# Patient Record
Sex: Female | Born: 1975 | Race: White | Hispanic: Yes | Smoking: Never smoker
Health system: Southern US, Community
[De-identification: ages and names within clinical notes are randomized; demographics above are authoritative.]

## PROBLEM LIST (undated history)

## (undated) DIAGNOSIS — Z789 Other specified health status: Secondary | ICD-10-CM

## (undated) HISTORY — PX: NO PAST SURGERIES: SHX2092

---

## 1999-09-05 ENCOUNTER — Inpatient Hospital Stay (HOSPITAL_COMMUNITY): Admission: AD | Admit: 1999-09-05 | Discharge: 1999-09-05 | Payer: Self-pay | Admitting: *Deleted

## 1999-09-23 ENCOUNTER — Other Ambulatory Visit: Admission: RE | Admit: 1999-09-23 | Discharge: 1999-09-23 | Payer: Self-pay | Admitting: Obstetrics

## 1999-09-23 ENCOUNTER — Encounter: Admission: RE | Admit: 1999-09-23 | Discharge: 1999-09-23 | Payer: Self-pay | Admitting: Obstetrics

## 1999-10-15 ENCOUNTER — Encounter: Payer: Self-pay | Admitting: Obstetrics

## 1999-10-15 ENCOUNTER — Ambulatory Visit (HOSPITAL_COMMUNITY): Admission: RE | Admit: 1999-10-15 | Discharge: 1999-10-15 | Payer: Self-pay | Admitting: Obstetrics

## 1999-11-02 ENCOUNTER — Encounter: Admission: RE | Admit: 1999-11-02 | Discharge: 1999-11-02 | Payer: Self-pay | Admitting: Obstetrics & Gynecology

## 2000-02-02 ENCOUNTER — Emergency Department (HOSPITAL_COMMUNITY): Admission: EM | Admit: 2000-02-02 | Discharge: 2000-02-02 | Payer: Self-pay | Admitting: Emergency Medicine

## 2000-02-02 ENCOUNTER — Encounter: Payer: Self-pay | Admitting: Emergency Medicine

## 2000-02-11 ENCOUNTER — Encounter: Admission: RE | Admit: 2000-02-11 | Discharge: 2000-02-11 | Payer: Self-pay | Admitting: Obstetrics & Gynecology

## 2001-03-23 ENCOUNTER — Emergency Department (HOSPITAL_COMMUNITY): Admission: EM | Admit: 2001-03-23 | Discharge: 2001-03-23 | Payer: Self-pay | Admitting: Emergency Medicine

## 2001-03-28 ENCOUNTER — Emergency Department (HOSPITAL_COMMUNITY): Admission: EM | Admit: 2001-03-28 | Discharge: 2001-03-28 | Payer: Self-pay | Admitting: Emergency Medicine

## 2001-04-02 ENCOUNTER — Encounter: Admission: RE | Admit: 2001-04-02 | Discharge: 2001-04-02 | Payer: Self-pay | Admitting: Family Medicine

## 2001-04-04 ENCOUNTER — Encounter: Payer: Self-pay | Admitting: Family Medicine

## 2001-04-04 ENCOUNTER — Encounter: Admission: RE | Admit: 2001-04-04 | Discharge: 2001-04-04 | Payer: Self-pay | Admitting: Family Medicine

## 2001-10-23 ENCOUNTER — Emergency Department (HOSPITAL_COMMUNITY): Admission: EM | Admit: 2001-10-23 | Discharge: 2001-10-23 | Payer: Self-pay | Admitting: Emergency Medicine

## 2003-02-17 ENCOUNTER — Encounter: Admission: RE | Admit: 2003-02-17 | Discharge: 2003-02-17 | Payer: Self-pay | Admitting: Family Medicine

## 2003-02-19 ENCOUNTER — Encounter: Payer: Self-pay | Admitting: Sports Medicine

## 2003-02-19 ENCOUNTER — Encounter: Admission: RE | Admit: 2003-02-19 | Discharge: 2003-02-19 | Payer: Self-pay | Admitting: Sports Medicine

## 2003-02-21 ENCOUNTER — Encounter: Admission: RE | Admit: 2003-02-21 | Discharge: 2003-02-21 | Payer: Self-pay | Admitting: Family Medicine

## 2003-10-11 ENCOUNTER — Inpatient Hospital Stay (HOSPITAL_COMMUNITY): Admission: AD | Admit: 2003-10-11 | Discharge: 2003-10-11 | Payer: Self-pay | Admitting: *Deleted

## 2003-10-13 ENCOUNTER — Inpatient Hospital Stay (HOSPITAL_COMMUNITY): Admission: AD | Admit: 2003-10-13 | Discharge: 2003-10-13 | Payer: Self-pay | Admitting: Obstetrics & Gynecology

## 2003-10-15 ENCOUNTER — Encounter (INDEPENDENT_AMBULATORY_CARE_PROVIDER_SITE_OTHER): Payer: Self-pay | Admitting: Specialist

## 2003-10-15 ENCOUNTER — Ambulatory Visit (HOSPITAL_COMMUNITY): Admission: RE | Admit: 2003-10-15 | Discharge: 2003-10-15 | Payer: Self-pay | Admitting: Obstetrics and Gynecology

## 2006-12-07 DIAGNOSIS — N83209 Unspecified ovarian cyst, unspecified side: Secondary | ICD-10-CM

## 2007-12-11 ENCOUNTER — Ambulatory Visit (HOSPITAL_COMMUNITY): Admission: RE | Admit: 2007-12-11 | Discharge: 2007-12-11 | Payer: Self-pay | Admitting: Family Medicine

## 2008-04-14 ENCOUNTER — Ambulatory Visit (HOSPITAL_COMMUNITY): Admission: RE | Admit: 2008-04-14 | Discharge: 2008-04-14 | Payer: Self-pay | Admitting: Family Medicine

## 2008-04-18 ENCOUNTER — Ambulatory Visit: Payer: Self-pay | Admitting: Physician Assistant

## 2008-04-18 ENCOUNTER — Inpatient Hospital Stay (HOSPITAL_COMMUNITY): Admission: AD | Admit: 2008-04-18 | Discharge: 2008-04-20 | Payer: Self-pay | Admitting: Obstetrics and Gynecology

## 2008-10-14 ENCOUNTER — Emergency Department (HOSPITAL_COMMUNITY): Admission: EM | Admit: 2008-10-14 | Discharge: 2008-10-14 | Payer: Self-pay | Admitting: Emergency Medicine

## 2010-01-01 ENCOUNTER — Emergency Department (HOSPITAL_COMMUNITY): Admission: EM | Admit: 2010-01-01 | Discharge: 2010-01-01 | Payer: Self-pay | Admitting: Emergency Medicine

## 2011-01-03 LAB — URINALYSIS, ROUTINE W REFLEX MICROSCOPIC
Glucose, UA: NEGATIVE mg/dL
Hgb urine dipstick: NEGATIVE
pH: 8 (ref 5.0–8.0)

## 2011-01-03 LAB — DIFFERENTIAL
Eosinophils Absolute: 0.1 10*3/uL (ref 0.0–0.7)
Eosinophils Relative: 1 % (ref 0–5)
Lymphocytes Relative: 37 % (ref 12–46)
Lymphs Abs: 2.7 10*3/uL (ref 0.7–4.0)
Monocytes Absolute: 0.5 10*3/uL (ref 0.1–1.0)

## 2011-01-03 LAB — CBC
MCHC: 34.3 g/dL (ref 30.0–36.0)
MCV: 89.2 fL (ref 78.0–100.0)
Platelets: 246 10*3/uL (ref 150–400)
RBC: 4.4 MIL/uL (ref 3.87–5.11)
WBC: 7.2 10*3/uL (ref 4.0–10.5)

## 2011-01-03 LAB — BASIC METABOLIC PANEL
BUN: 8 mg/dL (ref 6–23)
Calcium: 9.1 mg/dL (ref 8.4–10.5)
Creatinine, Ser: 0.71 mg/dL (ref 0.4–1.2)
GFR calc Af Amer: >60 mL/min (ref >60)

## 2011-01-03 LAB — WET PREP, GENITAL
Clue Cells Wet Prep HPF POC: NONE SEEN
Trich, Wet Prep: NONE SEEN
WBC, Wet Prep HPF POC: NONE SEEN
Yeast Wet Prep HPF POC: NONE SEEN

## 2011-02-25 NOTE — Op Note (Signed)
[name], [name]                   ACCOUNT NO.:  [id number]   MEDICAL RECORD NO.:  [id number]                   PATIENT TYPE:  AMB   LOCATION:  SDC                                  FACILITY:  [facility]   PHYSICIAN:  Phil D. Okey Dupre, M.D.                  DATE OF BIRTH:  1975-11-23   DATE OF PROCEDURE:  10/15/2003  DATE OF DISCHARGE:                                 OPERATIVE REPORT   PREOPERATIVE DIAGNOSIS:  Missed abortion.   POSTOPERATIVE DIAGNOSIS:  Incomplete abortion.   PROCEDURE:  Dilatation and evacuation.   SURGEON:  Javier Glazier. Okey Dupre, M.D.   ANESTHESIA:  MAC plus local.   ESTIMATED BLOOD LOSS:  5 mL.   The procedure went as follows:  Under satisfactory MAC analgesia with the  patient in the dorsal lithotomy position, the perineum and vagina were  prepped and draped in the usual sterile manner.  Bimanual pelvic examination  revealed the uterus about six weeks' gestational size with a cervix that  easily admitted a fingertip.  A weighted speculum was placed in the  posterior fourchette of the vagina.  The uterine cavity was sounded to a  depth of 10 cm.  The cervical os easily dilated to a #10 Hegar dilator and a  10 curved suction curette was used to evacuate the uterine cavity without  incident after 10 mL of 1% Xylocaine had been placed in both of the lateral  paracervical areas at 8 and 4 o'clock for local relief.  The products of  conception were sent for pathologic diagnosis.  The tenaculum and speculum  were removed from the vagina and the patient transferred to the recovery  room in satisfactory condition.                                               Phil D. Okey Dupre, M.D.    PDR/MEDQ  D:  10/15/2003  T:  10/15/2003  Job:  [id number]

## 2011-07-07 LAB — CBC
Platelets: 260
RDW: 14.1
WBC: 9

## 2011-07-07 LAB — RPR: RPR Ser Ql: NONREACTIVE

## 2014-01-27 ENCOUNTER — Emergency Department (HOSPITAL_COMMUNITY): Payer: No Typology Code available for payment source

## 2014-01-27 ENCOUNTER — Emergency Department (HOSPITAL_COMMUNITY)
Admission: EM | Admit: 2014-01-27 | Discharge: 2014-01-27 | Disposition: A | Discharge status: Home / Self Care | Payer: No Typology Code available for payment source | Attending: Emergency Medicine | Admitting: Emergency Medicine

## 2014-01-27 ENCOUNTER — Encounter (HOSPITAL_COMMUNITY): Payer: Self-pay | Admitting: Emergency Medicine

## 2014-01-27 DIAGNOSIS — IMO0002 Reserved for concepts with insufficient information to code with codable children: Secondary | ICD-10-CM | POA: Insufficient documentation

## 2014-01-27 DIAGNOSIS — A499 Bacterial infection, unspecified: Secondary | ICD-10-CM | POA: Insufficient documentation

## 2014-01-27 DIAGNOSIS — K5900 Constipation, unspecified: Secondary | ICD-10-CM | POA: Insufficient documentation

## 2014-01-27 DIAGNOSIS — Z3202 Encounter for pregnancy test, result negative: Secondary | ICD-10-CM | POA: Insufficient documentation

## 2014-01-27 DIAGNOSIS — N760 Acute vaginitis: Secondary | ICD-10-CM | POA: Insufficient documentation

## 2014-01-27 DIAGNOSIS — B9689 Other specified bacterial agents as the cause of diseases classified elsewhere: Secondary | ICD-10-CM | POA: Insufficient documentation

## 2014-01-27 DIAGNOSIS — R079 Chest pain, unspecified: Secondary | ICD-10-CM | POA: Insufficient documentation

## 2014-01-27 DIAGNOSIS — L02419 Cutaneous abscess of limb, unspecified: Secondary | ICD-10-CM

## 2014-01-27 LAB — CBC WITH DIFFERENTIAL/PLATELET
BASOS ABS: 0.1 10*3/uL (ref 0.0–0.1)
BASOS PCT: 1 % (ref 0–1)
EOS PCT: 4 % (ref 0–5)
Eosinophils Absolute: 0.4 10*3/uL (ref 0.0–0.7)
HEMATOCRIT: 38.2 % (ref 36.0–46.0)
HEMOGLOBIN: 13.1 g/dL (ref 12.0–15.0)
Lymphocytes Relative: 24 % (ref 12–46)
Lymphs Abs: 2.2 10*3/uL (ref 0.7–4.0)
MCH: 30.4 pg (ref 26.0–34.0)
MCHC: 34.3 g/dL (ref 30.0–36.0)
MCV: 88.6 fL (ref 78.0–100.0)
MONO ABS: 0.6 10*3/uL (ref 0.1–1.0)
MONOS PCT: 6 % (ref 3–12)
Neutro Abs: 6.1 10*3/uL (ref 1.7–7.7)
Neutrophils Relative %: 65 % (ref 43–77)
Platelets: 329 10*3/uL (ref 150–400)
RBC: 4.31 MIL/uL (ref 3.87–5.11)
RDW: 13.4 % (ref 11.5–15.5)
WBC: 9.3 10*3/uL (ref 4.0–10.5)

## 2014-01-27 LAB — I-STAT TROPONIN, ED: TROPONIN I, POC: 0 ng/mL (ref 0.00–0.08)

## 2014-01-27 LAB — PREGNANCY, URINE: Preg Test, Ur: NEGATIVE

## 2014-01-27 LAB — COMPREHENSIVE METABOLIC PANEL
ALBUMIN: 3.7 g/dL (ref 3.5–5.2)
ALT: 19 U/L (ref 0–35)
AST: 18 U/L (ref 0–37)
Alkaline Phosphatase: 68 U/L (ref 39–117)
BUN: 18 mg/dL (ref 6–23)
CALCIUM: 9 mg/dL (ref 8.4–10.5)
CO2: 24 meq/L (ref 19–32)
CREATININE: 0.72 mg/dL (ref 0.50–1.10)
Chloride: 100 mEq/L (ref 96–112)
GFR calc Af Amer: >90 mL/min (ref >90)
Glucose, Bld: 147 mg/dL — ABNORMAL HIGH (ref 70–99)
Potassium: 4.1 mEq/L (ref 3.7–5.3)
Sodium: 137 mEq/L (ref 137–147)
Total Protein: 7.6 g/dL (ref 6.0–8.3)

## 2014-01-27 LAB — LIPASE, BLOOD: LIPASE: 27 U/L (ref 11–59)

## 2014-01-27 LAB — WET PREP, GENITAL
TRICH WET PREP: NONE SEEN
YEAST WET PREP: NONE SEEN

## 2014-01-27 LAB — URINALYSIS, ROUTINE W REFLEX MICROSCOPIC
BILIRUBIN URINE: NEGATIVE
GLUCOSE, UA: NEGATIVE mg/dL
HGB URINE DIPSTICK: NEGATIVE
KETONES UR: NEGATIVE mg/dL
Leukocytes, UA: NEGATIVE
Nitrite: NEGATIVE
PROTEIN: NEGATIVE mg/dL
Specific Gravity, Urine: 1.016 (ref 1.005–1.030)
UROBILINOGEN UA: 0.2 mg/dL (ref 0.0–1.0)
pH: 5 (ref 5.0–8.0)

## 2014-01-27 LAB — D-DIMER, QUANTITATIVE (NOT AT ARMC)

## 2014-01-27 MED ORDER — KETOROLAC TROMETHAMINE 30 MG/ML IJ SOLN
30.0000 mg | Freq: Once | INTRAMUSCULAR | Status: AC
  Administered 2014-01-27: 30 mg via INTRAVENOUS
  Filled 2014-01-27: qty 1

## 2014-01-27 MED ORDER — CLINDAMYCIN HCL 300 MG PO CAPS: 300.0000 mg | ORAL_CAPSULE | Freq: Three times a day (TID) | ORAL | Status: DC

## 2014-01-27 MED ORDER — SODIUM CHLORIDE 0.9 % IV BOLUS (SEPSIS)
1000.0000 mL | Freq: Once | INTRAVENOUS | Status: AC
  Administered 2014-01-27: 1000 mL via INTRAVENOUS

## 2014-01-27 MED ORDER — CLINDAMYCIN HCL 300 MG PO CAPS
300.0000 mg | ORAL_CAPSULE | Freq: Once | ORAL | Status: AC
  Administered 2014-01-27: 300 mg via ORAL
  Filled 2014-01-27: qty 1

## 2014-01-27 NOTE — ED Provider Notes (Signed)
CSN: [id number]     Arrival date & time 01/27/14  1712 History   First MD Initiated Contact with Patient 01/27/14 2028     Chief Complaint  Patient presents with  . Abdominal Pain  . Chest Pain     (Consider location/radiation/quality/duration/timing/severity/associated sxs/prior Treatment) The history is provided by the patient.  Joanna Vega is a 38 y.o. female here with LLQ pain. LLQ pain for several years, worse in last 2 weeks. She had her period yesterday and had more pain today. Denies vaginal discharge. Denies urinary symptoms. Denies being pregnancy. Had some shortness of breath when she is in pain but denies shortness of breath at rest. Denies fever or chills or cough. Also noticed left axillary swelling since yesterday. Denies purulent drainage from axilla. No history of MRSA.    History reviewed. No pertinent past medical history. History reviewed. No pertinent past surgical history. No family history on file. History  Substance Use Topics  . Smoking status: Never Smoker   . Smokeless tobacco: Not on file  . Alcohol Use: No   OB History   Grav Para Term Preterm Abortions TAB SAB Ect Mult Living                 Review of Systems  Cardiovascular: Positive for chest pain.  Gastrointestinal: Positive for abdominal pain.  All other systems reviewed and are negative.     Allergies  Review of patient's allergies indicates no known allergies.  Home Medications   Prior to Admission medications   Not on File   BP 107/57  Pulse 62  Temp(Src) 98.3 F (36.8 C) (Oral)  Resp 18  Wt 118 lb (53.524 kg)  SpO2 100%  LMP 01/26/2014 Physical Exam  Nursing note and vitals reviewed. Constitutional: She is oriented to person, place, and time. She appears well-developed and well-nourished.  HENT:  Head: Normocephalic.  Mouth/Throat: Oropharynx is clear and moist.  Eyes: Conjunctivae and EOM are normal. Pupils are equal, round, and reactive to light.  Neck:  Normal range of motion. Neck supple.  Cardiovascular: Normal rate, regular rhythm and normal heart sounds.   Pulmonary/Chest: Effort normal and breath sounds normal. No respiratory distress. She has no wheezes. She has no rales.  Abdominal: Soft. Bowel sounds are normal.  Mild LLQ tenderness, no rebound. No CVAT   Genitourinary:  No bleeding or discharge. No CMT. Mild L adnexal tenderness   Musculoskeletal: Normal range of motion.  Neurological: She is alert and oriented to person, place, and time. No cranial nerve deficit. Coordination normal.  Skin: Skin is warm and dry.  L axilla with small 1 cm fluctuance   Psychiatric: She has a normal mood and affect. Her behavior is normal. Judgment and thought content normal.    ED Course  Procedures (including critical care time) Labs Review Labs Reviewed  WET PREP, GENITAL - Abnormal; Notable for the following:    Clue Cells Wet Prep HPF POC FEW (*)    WBC, Wet Prep HPF POC FEW (*)    All other components within normal limits  COMPREHENSIVE METABOLIC PANEL - Abnormal; Notable for the following:    Glucose, Bld 147 (*)    Total Bilirubin <0.2 (*)    All other components within normal limits  GC/CHLAMYDIA PROBE AMP  CBC WITH DIFFERENTIAL  LIPASE, BLOOD  PREGNANCY, URINE  URINALYSIS, ROUTINE W REFLEX MICROSCOPIC  D-DIMER, QUANTITATIVE  Rosezena Sensor, ED    Imaging Review Dg Chest 2 View  01/27/2014   CLINICAL  DATA:  Pain in lower chest  EXAM: CHEST  2 VIEW  COMPARISON:  None.  FINDINGS: Normal mediastinum and cardiac silhouette. Normal pulmonary vasculature. No evidence of effusion, infiltrate, or pneumothorax. No acute bony abnormality.  IMPRESSION: Normal chest radiograph.   Electronically Signed   By: Genevive BiStewart  Edmunds M.D.   On: 01/27/2014 18:47   Dg Abd 1 View  01/27/2014   CLINICAL DATA:  Left lower quadrant abdominal pain.  EXAM: ABDOMEN - 1 VIEW  COMPARISON:  None.  FINDINGS: Moderate right colonic stool noted.  The bowel gas  pattern is unremarkable otherwise.  No suspicious calcifications are identified.  The bony structures are unremarkable.  IMPRESSION: Moderate right colonic stool without other significant abnormality.   Electronically Signed   By: Laveda AbbeJeff  Hu M.D.   On: 01/27/2014 22:32   [place] Transvaginal Non-ob  01/27/2014   CLINICAL DATA:  Left adnexal pain.  To rule out torsion.  EXAM: TRANSABDOMINAL AND TRANSVAGINAL ULTRASOUND OF PELVIS  DOPPLER ULTRASOUND OF OVARIES  TECHNIQUE: Both transabdominal and transvaginal ultrasound examinations of the pelvis were performed. Transabdominal technique was performed for global imaging of the pelvis including uterus, ovaries, adnexal regions, and pelvic cul-de-sac.  It was necessary to proceed with endovaginal exam following the transabdominal exam to visualize the Ovaries. Color and duplex Doppler ultrasound was utilized to evaluate blood flow to the ovaries.  COMPARISON:  None.  FINDINGS: Uterus  Measurements: 7 x 3.4 x 4.3 cm. The uterus is retroflexed. No fibroids or other mass visualized.  Endometrium  Thickness: 2.3 mm, normal.  No focal abnormality visualized.  Right ovary  Measurements: 2.8 x 1.6 x 1.8 cm. Normal follicular changes. Flow is demonstrated in the right ovary on color flow Doppler imaging.  Left ovary  Measurements: 2.2 x 1.1 x 1.1 cm. Normal follicular changes. Flow is demonstrated in the left ovary on color flow Doppler imaging.  Pulsed Doppler evaluation of both ovaries demonstrates normal low-resistance arterial and venous waveforms.  Other findings  No free fluid.  Prominent gonadal veins.  Varices not excluded.  IMPRESSION: Normal sound appearance of the uterus and ovaries. No evidence of torsion.   Electronically Signed   By: Burman NievesWilliam  Stevens M.D.   On: 01/27/2014 22:15   [place] Pelvis Complete  01/27/2014   CLINICAL DATA:  Left adnexal pain.  To rule out torsion.  EXAM: TRANSABDOMINAL AND TRANSVAGINAL ULTRASOUND OF PELVIS  DOPPLER ULTRASOUND OF OVARIES   TECHNIQUE: Both transabdominal and transvaginal ultrasound examinations of the pelvis were performed. Transabdominal technique was performed for global imaging of the pelvis including uterus, ovaries, adnexal regions, and pelvic cul-de-sac.  It was necessary to proceed with endovaginal exam following the transabdominal exam to visualize the Ovaries. Color and duplex Doppler ultrasound was utilized to evaluate blood flow to the ovaries.  COMPARISON:  None.  FINDINGS: Uterus  Measurements: 7 x 3.4 x 4.3 cm. The uterus is retroflexed. No fibroids or other mass visualized.  Endometrium  Thickness: 2.3 mm, normal.  No focal abnormality visualized.  Right ovary  Measurements: 2.8 x 1.6 x 1.8 cm. Normal follicular changes. Flow is demonstrated in the right ovary on color flow Doppler imaging.  Left ovary  Measurements: 2.2 x 1.1 x 1.1 cm. Normal follicular changes. Flow is demonstrated in the left ovary on color flow Doppler imaging.  Pulsed Doppler evaluation of both ovaries demonstrates normal low-resistance arterial and venous waveforms.  Other findings  No free fluid.  Prominent gonadal veins.  Varices not excluded.  IMPRESSION: Normal  sound appearance of the uterus and ovaries. No evidence of torsion.   Electronically Signed   By: Burman NievesWilliam  Stevens M.D.   On: 01/27/2014 22:15   [place] Art/ven Flow Abd Pelv Doppler  01/27/2014   CLINICAL DATA:  Left adnexal pain.  To rule out torsion.  EXAM: TRANSABDOMINAL AND TRANSVAGINAL ULTRASOUND OF PELVIS  DOPPLER ULTRASOUND OF OVARIES  TECHNIQUE: Both transabdominal and transvaginal ultrasound examinations of the pelvis were performed. Transabdominal technique was performed for global imaging of the pelvis including uterus, ovaries, adnexal regions, and pelvic cul-de-sac.  It was necessary to proceed with endovaginal exam following the transabdominal exam to visualize the Ovaries. Color and duplex Doppler ultrasound was utilized to evaluate blood flow to the ovaries.  COMPARISON:   None.  FINDINGS: Uterus  Measurements: 7 x 3.4 x 4.3 cm. The uterus is retroflexed. No fibroids or other mass visualized.  Endometrium  Thickness: 2.3 mm, normal.  No focal abnormality visualized.  Right ovary  Measurements: 2.8 x 1.6 x 1.8 cm. Normal follicular changes. Flow is demonstrated in the right ovary on color flow Doppler imaging.  Left ovary  Measurements: 2.2 x 1.1 x 1.1 cm. Normal follicular changes. Flow is demonstrated in the left ovary on color flow Doppler imaging.  Pulsed Doppler evaluation of both ovaries demonstrates normal low-resistance arterial and venous waveforms.  Other findings  No free fluid.  Prominent gonadal veins.  Varices not excluded.  IMPRESSION: Normal sound appearance of the uterus and ovaries. No evidence of torsion.   Electronically Signed   By: Burman NievesWilliam  Stevens M.D.   On: 01/27/2014 22:15     EKG Interpretation None      MDM   Final diagnoses:  None   Joanna Vega is a 38 y.o. female here with L axilla early abscess and LLQ pain. LLQ pain likely constipation vs ruptured cyst vs torsion. SOB with pain, low risk for PE so will get d-dimer. I discussed with her regarding treatment options for the early abscess. I counseled her that these likely will return after I and D. Will give course of abx and will have her f/u with surgery for excision.   10:51 PM US showed no torsion or cysts. Xray showed constipation. D-dimer neg. Wet prep ? BV but has no symptoms. Given clinda which can help with cellulitis and possible BV. Abdominal pain likely from constipation vs BV. WBC nl. I doubt appendicitis. Stable for d/c.    Richardean Canalavid H Saretta Dahlem, MD 01/27/14 2252

## 2014-01-27 NOTE — ED Notes (Signed)
Pt reports LLQ abdominal pain, chest pain when she takes a deep breath for 2 weeks. Also abscess under left axilla. Denies vaginal bleeding or discharge. Pt is a x4. No n/v/d

## 2014-01-27 NOTE — Discharge Instructions (Signed)
Take clindamycin three times a day for a week.   Stay hydrated. Eat high fiber diet.   Apply warm soaks to the abscess.   Follow up with your regular doctor and a Careers advisersurgeon.   Try not to shave your armpits.   Return to ER if you have severe pain, vomiting, fever.

## 2014-01-28 LAB — GC/CHLAMYDIA PROBE AMP
CT Probe RNA: NEGATIVE
GC Probe RNA: NEGATIVE

## 2014-07-21 ENCOUNTER — Other Ambulatory Visit (HOSPITAL_COMMUNITY): Payer: Self-pay | Admitting: Nurse Practitioner

## 2014-07-21 DIAGNOSIS — Z3689 Encounter for other specified antenatal screening: Secondary | ICD-10-CM

## 2014-07-21 LAB — OB RESULTS CONSOLE ABO/RH: RH Type: POSITIVE

## 2014-07-21 LAB — OB RESULTS CONSOLE GC/CHLAMYDIA
CHLAMYDIA, DNA PROBE: NEGATIVE
GC PROBE AMP, GENITAL: NEGATIVE

## 2014-07-21 LAB — OB RESULTS CONSOLE HIV ANTIBODY (ROUTINE TESTING): HIV: NONREACTIVE

## 2014-07-21 LAB — OB RESULTS CONSOLE RPR: RPR: NONREACTIVE

## 2014-07-21 LAB — OB RESULTS CONSOLE RUBELLA ANTIBODY, IGM: Rubella: IMMUNE

## 2014-07-21 LAB — OB RESULTS CONSOLE ANTIBODY SCREEN: Antibody Screen: NEGATIVE

## 2014-07-21 LAB — OB RESULTS CONSOLE HEPATITIS B SURFACE ANTIGEN: HEP B S AG: NEGATIVE

## 2014-08-18 ENCOUNTER — Other Ambulatory Visit (HOSPITAL_COMMUNITY): Payer: Self-pay | Admitting: Nurse Practitioner

## 2014-08-18 DIAGNOSIS — O09522 Supervision of elderly multigravida, second trimester: Secondary | ICD-10-CM

## 2014-08-18 DIAGNOSIS — Z3689 Encounter for other specified antenatal screening: Secondary | ICD-10-CM

## 2014-08-19 ENCOUNTER — Ambulatory Visit (HOSPITAL_COMMUNITY)
Admission: RE | Admit: 2014-08-19 | Discharge: 2014-08-19 | Disposition: A | Discharge status: Home / Self Care | Payer: Medicaid Other | Source: Ambulatory Visit | Attending: Nurse Practitioner | Admitting: Nurse Practitioner

## 2014-08-19 ENCOUNTER — Other Ambulatory Visit (HOSPITAL_COMMUNITY): Payer: Self-pay | Admitting: Nurse Practitioner

## 2014-08-19 DIAGNOSIS — O4402 Placenta previa specified as without hemorrhage, second trimester: Secondary | ICD-10-CM | POA: Insufficient documentation

## 2014-08-19 DIAGNOSIS — O09529 Supervision of elderly multigravida, unspecified trimester: Secondary | ICD-10-CM | POA: Insufficient documentation

## 2014-08-19 DIAGNOSIS — O09522 Supervision of elderly multigravida, second trimester: Secondary | ICD-10-CM | POA: Diagnosis present

## 2014-08-19 DIAGNOSIS — Z36 Encounter for antenatal screening of mother: Secondary | ICD-10-CM | POA: Diagnosis not present

## 2014-08-19 DIAGNOSIS — Z3A19 19 weeks gestation of pregnancy: Secondary | ICD-10-CM | POA: Insufficient documentation

## 2014-08-19 DIAGNOSIS — Z3689 Encounter for other specified antenatal screening: Secondary | ICD-10-CM

## 2014-08-26 ENCOUNTER — Other Ambulatory Visit (HOSPITAL_COMMUNITY): Payer: Self-pay | Admitting: Physician Assistant

## 2014-08-26 DIAGNOSIS — Z0372 Encounter for suspected placental problem ruled out: Secondary | ICD-10-CM

## 2014-09-23 ENCOUNTER — Ambulatory Visit (HOSPITAL_COMMUNITY)
Admission: RE | Admit: 2014-09-23 | Discharge: 2014-09-23 | Disposition: A | Discharge status: Home / Self Care | Payer: Medicaid Other | Source: Ambulatory Visit | Attending: Physician Assistant | Admitting: Physician Assistant

## 2014-09-23 ENCOUNTER — Other Ambulatory Visit (HOSPITAL_COMMUNITY): Payer: Self-pay | Admitting: Physician Assistant

## 2014-09-23 ENCOUNTER — Encounter (HOSPITAL_COMMUNITY): Payer: Self-pay

## 2014-09-23 DIAGNOSIS — O4440 Low lying placenta NOS or without hemorrhage, unspecified trimester: Secondary | ICD-10-CM | POA: Insufficient documentation

## 2014-09-23 DIAGNOSIS — O4402 Placenta previa specified as without hemorrhage, second trimester: Secondary | ICD-10-CM | POA: Insufficient documentation

## 2014-09-23 DIAGNOSIS — Z3A24 24 weeks gestation of pregnancy: Secondary | ICD-10-CM | POA: Insufficient documentation

## 2014-09-23 DIAGNOSIS — O09529 Supervision of elderly multigravida, unspecified trimester: Secondary | ICD-10-CM | POA: Insufficient documentation

## 2014-09-23 DIAGNOSIS — Z0372 Encounter for suspected placental problem ruled out: Secondary | ICD-10-CM

## 2014-09-23 DIAGNOSIS — Z36 Encounter for antenatal screening of mother: Secondary | ICD-10-CM | POA: Insufficient documentation

## 2014-09-23 DIAGNOSIS — O09522 Supervision of elderly multigravida, second trimester: Secondary | ICD-10-CM | POA: Insufficient documentation

## 2014-09-23 HISTORY — DX: Other specified health status: Z78.9

## 2014-09-23 NOTE — ED Notes (Signed)
Eda Royal Spanish Interpreter at the bedside.

## 2014-10-10 NOTE — L&D Delivery Note (Addendum)
Patient is 39 y.o. [id number] 8625w2d admitted in active labor, hx of AMA, polyhydramnios   Delivery Note At 11:45 AM a viable female was delivered via Vaginal, Spontaneous Delivery (Presentation: Right Occiput Anterior).  APGAR: 8, 9; weight  pending Placenta status: Intact, Spontaneous.  Cord: 3 vessels with the following complications: none  Anesthesia: Epidural  Episiotomy: None Lacerations: None Suture Repair: n/a Est. Blood Loss (mL):  400mL  cytotec 1000mcg PR given 2/2 slow trickle   Mom to postpartum.  Baby to Couplet care / Skin to Skin.  Erina Hamme ROCIO 01/06/2015, 12:20 PM   Reevaluated 2/2 increased bleeding, 50cc clot evaluated from uterus, fundus still firm.    Total EBL 600mL, no methergine needed at this time.  Perry MountACOSTA,Tomma Ehinger ROCIO, MD 1:47 PM

## 2014-11-04 ENCOUNTER — Other Ambulatory Visit (HOSPITAL_COMMUNITY): Payer: Self-pay | Admitting: Physician Assistant

## 2014-11-04 ENCOUNTER — Encounter (HOSPITAL_COMMUNITY): Payer: Self-pay

## 2014-11-04 ENCOUNTER — Ambulatory Visit (HOSPITAL_COMMUNITY)
Admission: RE | Admit: 2014-11-04 | Discharge: 2014-11-04 | Disposition: A | Discharge status: Home / Self Care | Payer: Medicaid Other | Source: Ambulatory Visit | Attending: Physician Assistant | Admitting: Physician Assistant

## 2014-11-04 DIAGNOSIS — Z0372 Encounter for suspected placental problem ruled out: Secondary | ICD-10-CM

## 2014-11-04 DIAGNOSIS — O4403 Placenta previa specified as without hemorrhage, third trimester: Secondary | ICD-10-CM | POA: Insufficient documentation

## 2014-11-04 DIAGNOSIS — Z3A30 30 weeks gestation of pregnancy: Secondary | ICD-10-CM | POA: Insufficient documentation

## 2014-11-04 DIAGNOSIS — O09523 Supervision of elderly multigravida, third trimester: Secondary | ICD-10-CM | POA: Insufficient documentation

## 2014-11-04 DIAGNOSIS — O409XX0 Polyhydramnios, unspecified trimester, not applicable or unspecified: Secondary | ICD-10-CM | POA: Insufficient documentation

## 2014-11-04 NOTE — ED Notes (Signed)
Mariel Gallege spanish interpreter with pt. 

## 2014-11-20 ENCOUNTER — Other Ambulatory Visit (HOSPITAL_COMMUNITY): Payer: Self-pay | Admitting: Physician Assistant

## 2014-11-20 ENCOUNTER — Ambulatory Visit (HOSPITAL_COMMUNITY)
Admission: RE | Admit: 2014-11-20 | Discharge: 2014-11-20 | Disposition: A | Discharge status: Home / Self Care | Payer: Medicaid Other | Source: Ambulatory Visit | Attending: Physician Assistant | Admitting: Physician Assistant

## 2014-11-20 ENCOUNTER — Encounter (HOSPITAL_COMMUNITY): Payer: Self-pay

## 2014-11-20 DIAGNOSIS — O09522 Supervision of elderly multigravida, second trimester: Secondary | ICD-10-CM | POA: Insufficient documentation

## 2014-11-20 DIAGNOSIS — Z0372 Encounter for suspected placental problem ruled out: Secondary | ICD-10-CM

## 2014-11-20 DIAGNOSIS — Z3A19 19 weeks gestation of pregnancy: Secondary | ICD-10-CM | POA: Insufficient documentation

## 2014-11-20 DIAGNOSIS — Z3A32 32 weeks gestation of pregnancy: Secondary | ICD-10-CM | POA: Insufficient documentation

## 2014-11-20 DIAGNOSIS — Z36 Encounter for antenatal screening of mother: Secondary | ICD-10-CM | POA: Insufficient documentation

## 2014-11-20 NOTE — ED Notes (Signed)
Spanish interpreter Marlene Norway with pt. 

## 2014-12-20 LAB — OB RESULTS CONSOLE GBS: GBS: NEGATIVE

## 2015-01-06 ENCOUNTER — Inpatient Hospital Stay (HOSPITAL_COMMUNITY)
Admission: AD | Admit: 2015-01-06 | Discharge: 2015-01-07 | DRG: 775 | Disposition: A | Discharge status: Home / Self Care | Payer: Medicaid Other | Source: Ambulatory Visit | Attending: Family Medicine | Admitting: Family Medicine

## 2015-01-06 ENCOUNTER — Inpatient Hospital Stay (HOSPITAL_COMMUNITY): Payer: Medicaid Other | Admitting: Anesthesiology

## 2015-01-06 ENCOUNTER — Encounter (HOSPITAL_COMMUNITY): Payer: Self-pay | Admitting: *Deleted

## 2015-01-06 DIAGNOSIS — Z3A39 39 weeks gestation of pregnancy: Secondary | ICD-10-CM | POA: Diagnosis not present

## 2015-01-06 DIAGNOSIS — IMO0001 Reserved for inherently not codable concepts without codable children: Secondary | ICD-10-CM

## 2015-01-06 LAB — CBC
HCT: 36.7 % (ref 36.0–46.0)
HEMATOCRIT: 30.3 % — AB (ref 36.0–46.0)
HEMOGLOBIN: 12.5 g/dL (ref 12.0–15.0)
Hemoglobin: 10.3 g/dL — ABNORMAL LOW (ref 12.0–15.0)
MCH: 30.5 pg (ref 26.0–34.0)
MCH: 30.7 pg (ref 26.0–34.0)
MCHC: 34.1 g/dL (ref 30.0–36.0)
MCHC: 34 g/dL (ref 30.0–36.0)
MCV: 89.5 fL (ref 78.0–100.0)
MCV: 90.2 fL (ref 78.0–100.0)
Platelets: 207 10*3/uL (ref 150–400)
Platelets: 178 10*3/uL (ref 150–400)
RBC: 4.1 MIL/uL (ref 3.87–5.11)
RBC: 3.36 MIL/uL — ABNORMAL LOW (ref 3.87–5.11)
RDW: 14.6 % (ref 11.5–15.5)
RDW: 14.8 % (ref 11.5–15.5)
WBC: 10.4 10*3/uL (ref 4.0–10.5)
WBC: 16.6 10*3/uL — ABNORMAL HIGH (ref 4.0–10.5)

## 2015-01-06 LAB — TYPE AND SCREEN
ABO/RH(D): O POS
Antibody Screen: NEGATIVE

## 2015-01-06 LAB — ABO/RH: ABO/RH(D): O POS

## 2015-01-06 LAB — RPR: RPR Ser Ql: NONREACTIVE

## 2015-01-06 MED ORDER — SENNOSIDES-DOCUSATE SODIUM 8.6-50 MG PO TABS
2.0000 | ORAL_TABLET | ORAL | Status: DC
  Administered 2015-01-06: 2 via ORAL
  Filled 2015-01-06: qty 2

## 2015-01-06 MED ORDER — FENTANYL 2.5 MCG/ML BUPIVACAINE 1/10 % EPIDURAL INFUSION (WH - ANES)
14.0000 mL/h | INTRAMUSCULAR | Status: DC | PRN
  Administered 2015-01-06: 12 mL/h via EPIDURAL
  Filled 2015-01-06: qty 125

## 2015-01-06 MED ORDER — LIDOCAINE HCL (PF) 1 % IJ SOLN
INTRAMUSCULAR | Status: DC | PRN
  Administered 2015-01-06 (×2): 4 mL

## 2015-01-06 MED ORDER — FENTANYL 2.5 MCG/ML BUPIVACAINE 1/10 % EPIDURAL INFUSION (WH - ANES): 14.0000 mL/h | INTRAMUSCULAR | Status: DC | PRN

## 2015-01-06 MED ORDER — PRENATAL MULTIVITAMIN CH
1.0000 | ORAL_TABLET | Freq: Every day | ORAL | Status: DC
  Administered 2015-01-06 – 2015-01-07 (×2): 1 via ORAL
  Filled 2015-01-06 (×2): qty 1

## 2015-01-06 MED ORDER — MISOPROSTOL 200 MCG PO TABS
1000.0000 ug | ORAL_TABLET | Freq: Once | ORAL | Status: AC
  Administered 2015-01-06: 1000 ug via RECTAL

## 2015-01-06 MED ORDER — MISOPROSTOL 200 MCG PO TABS
ORAL_TABLET | ORAL | Status: AC
  Filled 2015-01-06: qty 5

## 2015-01-06 MED ORDER — OXYCODONE-ACETAMINOPHEN 5-325 MG PO TABS: 1.0000 | ORAL_TABLET | ORAL | Status: DC | PRN

## 2015-01-06 MED ORDER — ONDANSETRON HCL 4 MG PO TABS: 4.0000 mg | ORAL_TABLET | ORAL | Status: DC | PRN

## 2015-01-06 MED ORDER — SODIUM CHLORIDE 0.9 % IV SOLN: 250.0000 mL | INTRAVENOUS | Status: DC | PRN

## 2015-01-06 MED ORDER — ZOLPIDEM TARTRATE 5 MG PO TABS: 5.0000 mg | ORAL_TABLET | Freq: Every evening | ORAL | Status: DC | PRN

## 2015-01-06 MED ORDER — OXYTOCIN BOLUS FROM INFUSION
500.0000 mL | INTRAVENOUS | Status: DC
  Administered 2015-01-06: 500 mL via INTRAVENOUS

## 2015-01-06 MED ORDER — DIPHENHYDRAMINE HCL 50 MG/ML IJ SOLN: 12.5000 mg | INTRAMUSCULAR | Status: DC | PRN

## 2015-01-06 MED ORDER — LACTATED RINGERS IV SOLN: 500.0000 mL | INTRAVENOUS | Status: DC | PRN

## 2015-01-06 MED ORDER — LACTATED RINGERS IV SOLN
INTRAVENOUS | Status: DC
Start: 2015-01-06 — End: 2015-01-06
  Administered 2015-01-06: 09:00:00 via INTRAVENOUS

## 2015-01-06 MED ORDER — EPHEDRINE 5 MG/ML INJ
10.0000 mg | INTRAVENOUS | Status: DC | PRN
  Filled 2015-01-06: qty 2

## 2015-01-06 MED ORDER — BENZOCAINE-MENTHOL 20-0.5 % EX AERO: 1.0000 "application " | INHALATION_SPRAY | CUTANEOUS | Status: DC | PRN

## 2015-01-06 MED ORDER — DIPHENHYDRAMINE HCL 25 MG PO CAPS: 25.0000 mg | ORAL_CAPSULE | Freq: Four times a day (QID) | ORAL | Status: DC | PRN

## 2015-01-06 MED ORDER — PHENYLEPHRINE 40 MCG/ML (10ML) SYRINGE FOR IV PUSH (FOR BLOOD PRESSURE SUPPORT)
80.0000 ug | PREFILLED_SYRINGE | INTRAVENOUS | Status: DC | PRN
  Filled 2015-01-06: qty 2

## 2015-01-06 MED ORDER — IBUPROFEN 600 MG PO TABS
600.0000 mg | ORAL_TABLET | Freq: Four times a day (QID) | ORAL | Status: DC
  Administered 2015-01-06 – 2015-01-07 (×4): 600 mg via ORAL
  Filled 2015-01-06 (×4): qty 1

## 2015-01-06 MED ORDER — LIDOCAINE HCL (PF) 1 % IJ SOLN
30.0000 mL | INTRAMUSCULAR | Status: DC | PRN
  Filled 2015-01-06: qty 30

## 2015-01-06 MED ORDER — CITRIC ACID-SODIUM CITRATE 334-500 MG/5ML PO SOLN: 30.0000 mL | ORAL | Status: DC | PRN

## 2015-01-06 MED ORDER — OXYTOCIN 40 UNITS IN LACTATED RINGERS INFUSION - SIMPLE MED: 62.5000 mL/h | INTRAVENOUS | Status: DC | PRN

## 2015-01-06 MED ORDER — LANOLIN HYDROUS EX OINT: TOPICAL_OINTMENT | CUTANEOUS | Status: DC | PRN

## 2015-01-06 MED ORDER — ONDANSETRON HCL 4 MG/2ML IJ SOLN: 4.0000 mg | Freq: Four times a day (QID) | INTRAMUSCULAR | Status: DC | PRN

## 2015-01-06 MED ORDER — WITCH HAZEL-GLYCERIN EX PADS: 1.0000 "application " | MEDICATED_PAD | CUTANEOUS | Status: DC | PRN

## 2015-01-06 MED ORDER — OXYCODONE-ACETAMINOPHEN 5-325 MG PO TABS: 2.0000 | ORAL_TABLET | ORAL | Status: DC | PRN

## 2015-01-06 MED ORDER — SODIUM CHLORIDE 0.9 % IJ SOLN: 3.0000 mL | Freq: Two times a day (BID) | INTRAMUSCULAR | Status: DC

## 2015-01-06 MED ORDER — DIBUCAINE 1 % RE OINT: 1.0000 "application " | TOPICAL_OINTMENT | RECTAL | Status: DC | PRN

## 2015-01-06 MED ORDER — ONDANSETRON HCL 4 MG/2ML IJ SOLN: 4.0000 mg | INTRAMUSCULAR | Status: DC | PRN

## 2015-01-06 MED ORDER — PHENYLEPHRINE 40 MCG/ML (10ML) SYRINGE FOR IV PUSH (FOR BLOOD PRESSURE SUPPORT)
80.0000 ug | PREFILLED_SYRINGE | INTRAVENOUS | Status: DC | PRN
  Filled 2015-01-06: qty 2
  Filled 2015-01-06: qty 20

## 2015-01-06 MED ORDER — SIMETHICONE 80 MG PO CHEW: 80.0000 mg | CHEWABLE_TABLET | ORAL | Status: DC | PRN

## 2015-01-06 MED ORDER — OXYTOCIN 40 UNITS IN LACTATED RINGERS INFUSION - SIMPLE MED
62.5000 mL/h | INTRAVENOUS | Status: DC
  Filled 2015-01-06: qty 1000

## 2015-01-06 MED ORDER — FENTANYL CITRATE 0.05 MG/ML IJ SOLN: 100.0000 ug | INTRAMUSCULAR | Status: DC | PRN

## 2015-01-06 MED ORDER — SODIUM CHLORIDE 0.9 % IJ SOLN: 3.0000 mL | INTRAMUSCULAR | Status: DC | PRN

## 2015-01-06 MED ORDER — ACETAMINOPHEN 325 MG PO TABS: 650.0000 mg | ORAL_TABLET | ORAL | Status: DC | PRN

## 2015-01-06 MED ORDER — FLEET ENEMA 7-19 GM/118ML RE ENEM: 1.0000 | ENEMA | Freq: Every day | RECTAL | Status: DC | PRN

## 2015-01-06 MED ORDER — LACTATED RINGERS IV SOLN
500.0000 mL | Freq: Once | INTRAVENOUS | Status: AC
  Administered 2015-01-06: 500 mL via INTRAVENOUS

## 2015-01-06 MED ORDER — BISACODYL 10 MG RE SUPP: 10.0000 mg | Freq: Every day | RECTAL | Status: DC | PRN

## 2015-01-06 NOTE — Progress Notes (Signed)
I assisted Clydie BraunKaren, RN with questions. Eda H Royal  Interpreter.

## 2015-01-06 NOTE — Progress Notes (Signed)
I assisted Joanna Rilyshley Rn with some explanation of plan of care for tonight, by Orlan LeavensViria Alvarez Spanish Interpreter

## 2015-01-06 NOTE — Progress Notes (Signed)
I stopped to check on patient's need, I assisted the [facility] Surgery[facility] RN with some explanation of care, and I ordered patiet's meals, by Orlan LeavensViria Alvarez Spanish Interpreter

## 2015-01-06 NOTE — H&P (Cosign Needed)
LABOR ADMISSION HISTORY AND PHYSICAL  Joanna Vega is a 39 y.o. female [phone] with IUP at 10956w2d by 19week US presenting for active labor. She reports +FMs, No LOF, no VB, no blurry vision, headaches or peripheral edema, and RUQ pain.  She plans on breast and bottle feeding. She request Depo provera for birth control.  Spanish speaking, arriving from [place]. Most of her children were born in [place]. Has history of 5 previous pregnancies with one of these being a SAB at 12 weeks. Other pregnancies were carried to term, with one induction at 42 weeks. Baby sizes range from 7 pounds to 7 pounds 11 oz. Current baby 55th percentile in weight. US at 19 weeks initially showed placenta previa, however this has been shown to have resolved on more recent US. US in 11/04/14 showed mild polyhydramnios, however this has also been shown to have resolved in more recent US on 2/16.   Prenatal History/Complications:  Past Medical History: Past Medical History  Diagnosis Date  . Medical history non-contributory     Past Surgical History: Past Surgical History  Procedure Laterality Date  . No past surgeries      Obstetrical History: OB History    Gravida Para Term Preterm AB TAB SAB Ectopic Multiple Living   6 4 4  1  1   4       Social History: History   Social History  . Marital Status: Married    Spouse Name: N/A  . Number of Children: N/A  . Years of Education: N/A   Social History Main Topics  . Smoking status: Never Smoker   . Smokeless tobacco: Not on file  . Alcohol Use: No  . Drug Use: No  . Sexual Activity: Yes   Other Topics Concern  . None   Social History Narrative    Family History: History reviewed. No pertinent family history.  Allergies: No Known Allergies  Prescriptions prior to admission  Medication Sig Dispense Refill Last Dose  . Prenatal Vit-Fe Fumarate-FA (PRENATAL VITAMIN PO) Take by mouth.   01/05/2015 at Unknown time  . clindamycin  (CLEOCIN) 300 MG capsule Take 1 capsule (300 mg total) by mouth 3 (three) times daily. X 7 days (Patient not taking: Reported on 09/23/2014) 21 capsule 0 Unknown at Unknown time     Review of Systems   All systems reviewed and negative except as stated in HPI  Blood pressure 119/72, pulse 60, temperature 97.5 F (36.4 C), temperature source Oral, resp. rate 16, height 4\' 11"  (1.499 m), weight 61.236 kg (135 lb), last menstrual period 04/06/2014. General appearance: alert, cooperative and no distress Lungs: clear to auscultation bilaterally Heart: regular rate and rhythm Abdomen: soft, non-tender; bowel sounds normal Extremities: Homans sign is negative, no sign of DVT Presentation: cephalic Fetal monitoringBaseline: 120 bpm, Variability: Good {> 6 bpm), Accelerations: Reactive and Decelerations: Absent Uterine activity: Irregular  Dilation: 5.5 Effacement (%): 100 Station: -2 Exam by:: Elie ConferK. Weiss RN   Prenatal labs: ABO, Rh:   O Positive Antibody:  Negative Rubella:   Immune RPR:    Non-reactive HBsAg:   Negative HIV:   Non-reactive GBS:   Negative 1 hr Glucola 103, 126 Genetic screening  Negative Anatomy US Normal   No results found for this or any previous visit (from the past 24 hour(s)).  Patient Active Problem List   Diagnosis Date Noted  . [redacted] weeks gestation of pregnancy   . [redacted] weeks gestation of pregnancy   . Placental  problem suspected but not found   . Polyhydramnios   . [redacted] weeks gestation of pregnancy   . Low lying placenta without hemorrhage, antepartum   . Maternal age 73+, multigravida, antepartum   . Advanced maternal age in multigravida   . Encounter for fetal anatomic survey   . Placenta previa antepartum in second trimester   . [redacted] weeks gestation of pregnancy   . OVARIAN CYST 12/07/2006    Assessment: Joanna Vega is a 39 y.o. [id number] at [redacted]w[redacted]d here for active labor  #Labor: Active Labor. Continue to monitor. #Pain: Percocet PRN.  Fentanyl PRN. Epidural available at patient request.  #FWB: Category 1 #ID:  GBS negative #MOF: Breast/Bottle #MOC: Depo provera #Circ:  No  Araceli Bouche 01/06/2015, 3:22 AM   OB fellow attestation:  I have seen and examined this patient; I agree with above documentation in the resident's note.   Joanna Vega is a 39 y.o. [phone] here for active labor at term.   PE: BP 106/72 mmHg  Pulse 74  Temp(Src) 98.1 F (36.7 C) (Oral)  Resp 20  Ht  (1.499 m)  Wt 135 lb (61.236 kg)  BMI 27.25 kg/m2  SpO2 100%  LMP 04/06/2014 Gen: calm comfortable, NAD Resp: normal effort, no distress Abd: gravid  ROS, labs, PMH reviewed  Plan: MOF: Breast and Bottle MOC: Depo ID: GBS negative FWB: Category I Labor: Expectant management Circ: Declines Pain: Epidural  Joanna Vega 01/06/2015, 5:02 AM

## 2015-01-06 NOTE — Anesthesia Preprocedure Evaluation (Signed)

## 2015-01-06 NOTE — Plan of Care (Signed)
Problem: Consults Goal: Birthing Suites Patient Information Press F2 to bring up selections list  Outcome: Completed/Met Date Met:  01/06/15  Pt 37-[redacted] weeks EGA and Non-English Speaking

## 2015-01-06 NOTE — Anesthesia Procedure Notes (Signed)
Epidural Patient location during procedure: OB  Preanesthetic Checklist Completed: patient identified, site marked, surgical consent, pre-op evaluation, timeout performed, IV checked, risks and benefits discussed and monitors and equipment checked  Epidural Patient position: sitting Prep: site prepped and draped and DuraPrep Patient monitoring: continuous pulse ox and blood pressure Approach: midline Location: L3-L4 Injection technique: LOR air  Needle:  Needle type: Tuohy  Needle gauge: 17 G Needle length: 9 cm and 9 Needle insertion depth: 4 cm Catheter type: closed end flexible Catheter size: 19 Gauge Catheter at skin depth: 10 cm Test dose: negative  Assessment Events: blood not aspirated, injection not painful, no injection resistance, negative IV test and no paresthesia  Additional Notes Dosing of Epidural:  1st dose, through catheter ............................................. Xylocaine 30 mg  2nd dose, through catheter, after waiting 3 minutes........Xylocaine 50 mg   ( 1% Xylo charted as a single dose in Epic Meds for ease of charting; actual dosing was fractionated as above, for saftey's sake)  As each dose occurred, patient was free of IV sx; and patient exhibited no evidence of SA injection.  Patient is more comfortable after epidural dosed. Please see RN's note for documentation of vital signs,and FHR which are stable.  Patient reminded not to try to ambulate with numb legs, and that an RN must be present the 1st time she attempts to get up.    

## 2015-01-06 NOTE — Lactation Note (Signed)
This note was copied from the chart of Joanna Vega. Lactation Consultation Note  Patient Name: Joanna Vega ZOXWR'UToday's Date: 01/06/2015 Reason for consult: Initial assessment of this mom and baby at 9 hours pp. This is mom's 5th child and she states she breastfed all children for 2 years each.  Baby is latched well and mom denies any latching difficulty or nipple pain.  LC encouraged frequent STS and cue feedings.  Mom informs LC that she knows how to hand express her milk and RN had also reviewed hand expression technique.Mom encouraged to feed baby 8-12 times/24 hours and with feeding cues. LC encouraged review of Baby and Me (Spanish)  pp 9, 14 and 20-25 for STS and BF information. LC also informed parents of availability of Newborn Channel  (Spanish) during hospital stay and after discharge home, via website.LC provided [employer] Resource brochure in Spanish, and reviewed [facility] services and list of community and web site resources, especially [facility] website which has information available in [place].. This LC able to speak Spanish and mom declined need for interpreter at this time.   Maternal Data Formula Feeding for Exclusion: Yes Reason for exclusion: Mother's choice to formula and breast feed on admission Has patient been taught Hand Expression?: Yes (Mom informs LC that she knows how to hand express her milk) Does the patient have breastfeeding experience prior to this delivery?: Yes  Feeding Feeding Type: Breast Fed Length of feed: 20 min  LATCH Score/Interventions Latch: Repeated attempts needed to sustain latch, nipple held in mouth throughout feeding, stimulation needed to elicit sucking reflex. Intervention(s): Adjust position;Assist with latch;Breast compression  Audible Swallowing: A few with stimulation Intervention(s): Skin to skin;Hand expression  Type of Nipple: Everted at rest and after stimulation  Comfort (Breast/Nipple): Soft / non-tender     Hold  (Positioning): Assistance needed to correctly position infant at breast and maintain latch. Intervention(s): Breastfeeding basics reviewed;Support Pillows;Position options;Skin to skin  LATCH Score: 7  (Initial LATCH score=10, per RN assessment)  Lactation Tools Discussed/Used    STS, cue feeding, hand expression  Consult Status Consult Status: Follow-up Date: 01/07/15 Follow-up type: In-patient    Warrick ParisianBryant, Brix Brearley [facility] 01/06/2015, 9:09 PM

## 2015-01-06 NOTE — Anesthesia Postprocedure Evaluation (Signed)
  Anesthesia Post-op Note  Patient: Joanna Vega  Procedure(s) Performed: * No procedures listed *  Patient Location: Mother/Baby  Anesthesia Type:Epidural  Level of Consciousness: awake  Airway and Oxygen Therapy: Patient Spontanous Breathing  Post-op Pain: mild  Post-op Assessment: Patient's Cardiovascular Status Stable and Respiratory Function Stable  Post-op Vital Signs: stable  Last Vitals:  Filed Vitals:   01/06/15 1503  BP: 106/62  Pulse: 75  Temp: 36.9 C  Resp: 18    Complications: No apparent anesthesia complications

## 2015-01-07 ENCOUNTER — Encounter (HOSPITAL_COMMUNITY): Payer: Self-pay | Admitting: Student

## 2015-01-07 NOTE — Discharge Summary (Signed)
Obstetric Discharge Summary Reason for Admission: onset of labor Prenatal Procedures: none Intrapartum Procedures: spontaneous vaginal delivery Postpartum Procedures: none Complications-Operative and Postpartum: none  Delivery Note At 11:45 AM a viable female was delivered via Vaginal, Spontaneous Delivery (Presentation: Right Occiput Anterior).  APGAR: 8, 9; weight 8 lb 4.1 oz (3745 g).   Placenta status: Intact, Spontaneous.  Cord: 3 vessels with the following complications: .   Anesthesia: Epidural  Episiotomy: None Lacerations: None Suture Repair: N/A Est. Blood Loss (mL): 400  Mom to postpartum.  Baby to Couplet care / Skin to Skin.  Joanna Vega 01/07/2015, 7:56 AM     Hospital Course:  Active Problems:   Joanna Vega is a 39 y.o. [id number] s/p SVD PPD#1.  Patient was admitted to L&D.  She has postpartum course that was uncomplicated including no problems with ambulating, PO intake, urination, pain, or bleeding. The pt feels ready to go home and  will be discharged with outpatient follow-up. Plans for outpatient circumcision.  Today: No acute events overnight.  Pt denies problems with ambulating, voiding or po intake.  She denies nausea or vomiting.  Pain is well controlled.  She has had flatus. She has not had bowel movement.  Lochia Minimal.  Plan for birth control is  Depo-Provera.  Method of Feeding: breast   H/H: Lab Results  Component Value Date/Time   HGB 10.3* 01/06/2015 02:00 PM   HCT 30.3* 01/06/2015 02:00 PM    Discharge Diagnoses: Term Pregnancy-delivered  Discharge Information: Date: 01/07/2015 Activity: pelvic rest Diet: routine  Medications: None Breast feeding:  Yes Condition: stable Instructions: refer to handout Discharge to: home   Discharge Instructions    Call MD for:  redness, tenderness, or signs of infection (pain, swelling, redness, odor or green/yellow discharge around incision site)    Complete by:  As directed       Call MD for:  severe uncontrolled pain    Complete by:  As directed      Call MD for:  temperature >100.4    Complete by:  As directed      Diet - low sodium heart healthy    Complete by:  As directed             Medication List    TAKE these medications        PRENATAL VITAMIN PO  Take by mouth.           Follow-up Information    [facility] [place] In 6 weeks.   Contact information:   [address] [place] [id number] [phone]      Joanna Vega, Medical Student 01/07/2015,7:56 AM

## 2015-01-07 NOTE — Progress Notes (Signed)
Patient ID: Joanna Vega, female   DOB: Jul 19, 1976, 39 y.o.   MRN: [id number] Post Partum Day 1 Subjective:  Joanna Vega is a 39 y.o. [id number] 547w2d s/p SVD PPD#1.  No acute events overnight.  Pt denies problems with ambulating, voiding or po intake.  She denies nausea or vomiting.  Pain is well controlled with ibuprofen.  She has had flatus. She has not had bowel movement.  Lochia Minimal.  Plan for birth control is Depo-Provera.  Method of Feeding: breast  Objective: Blood pressure 98/59, pulse 72, temperature 97.9 F (36.6 C), temperature source Oral, resp. rate 18, height 4\' 11"  (1.499 m), weight 61.236 kg (135 lb), last menstrual period 04/06/2014, SpO2 99 %, currently breastfeeding.  Physical Exam:  General: alert, cooperative and no distress Lochia: normal flow Chest: no increased work of breathing Abdomen:  soft, nontender,  Uterine Fundus: firm, slight bilateral pelvic tenderness DVT Evaluation: No evidence of DVT seen on physical exam. Extremities: no edema   Recent Labs  01/06/15 0356 01/06/15 1400  HGB 12.5 10.3*  HCT 36.7 30.3*    Assessment/Plan:  ASSESSMENT: Joanna Vega is a [age] y.o. E3P2951G6P5015 657w2d s/p SVD PPD#1 who is doing well.    Plan for discharge tomorrow, Breastfeeding and Contraception Depo-Provera. Undecided on circumcision due to cost.    LOS: 1 day   Jeralyn RuthsNicholas A Carmyn Hamm, Medical Student 01/07/2015, 7:23 AM

## 2015-01-07 NOTE — Discharge Instructions (Signed)

## 2015-01-07 NOTE — Progress Notes (Signed)
I assisted PA with questions. Eda H Royal  Interpreter.

## 2015-01-07 NOTE — Progress Notes (Signed)
Dc teaching completed with Interpreter all questions answered.

## 2015-01-07 NOTE — Progress Notes (Signed)
UR chart review completed.  

## 2015-12-18 ENCOUNTER — Encounter: Payer: Self-pay | Admitting: Internal Medicine

## 2015-12-18 ENCOUNTER — Ambulatory Visit (INDEPENDENT_AMBULATORY_CARE_PROVIDER_SITE_OTHER): Payer: Self-pay | Admitting: Internal Medicine

## 2015-12-18 VITALS — BP 102/68 | HR 68 | Ht 59.0 in | Wt 115.0 lb

## 2015-12-18 DIAGNOSIS — F938 Other childhood emotional disorders: Secondary | ICD-10-CM

## 2015-12-18 DIAGNOSIS — M6248 Contracture of muscle, other site: Secondary | ICD-10-CM

## 2015-12-18 DIAGNOSIS — M62838 Other muscle spasm: Secondary | ICD-10-CM

## 2015-12-18 DIAGNOSIS — IMO0001 Reserved for inherently not codable concepts without codable children: Secondary | ICD-10-CM

## 2015-12-18 NOTE — Patient Instructions (Signed)
Habla clinica si tiene mas dolor de cabeza  Lehman Brothersome mucho agua todos los dias

## 2015-12-18 NOTE — Progress Notes (Signed)
   Subjective:    Patient ID: Joanna Vega, female    DOB: December 10, 1975, 40 y.o.   MRN: [id number]  HPI   New to establish  Has had pressure in her head for the past month.  Feels like someone is pulling on the left temporal area of head and then lets go.   Only happens when she is awake.  Can happen at any time--when she is busy with cooking, cleaning or when relaxing.  Pt. States the discomfort is there for a second and then gone.  States it really isn't a pain--just an odd sensation.  Discreet episodes 3 times weekly.  Not a daily occurrence. Has never had this prior to one month ago.   No history of injury to head.   Is getting Depo Provera every 3 months from PHD.  Was on DepoProvera for 5 years, became pregnant, delivered and back on it for past 10 months.  Last dose February 9th Does not take any other medication. No symptoms precede this sense of pulling of her scalp--just comes and goes. No change in frequency or severity since started. No associated photophobia, phonophobia, nausea or vomiting. May have a little stress she is dealing with as well.  Her 38 yo daughter doesn't listen to her, feels she is disrespectful, she is concerned about the young man her daughter is dating as well.       Review of Systems     Objective:   Physical Exam  Constitutional: She is oriented to person, place, and time. She appears well-developed and well-nourished.  HENT:  Head: Normocephalic and atraumatic.  Right Ear: Tympanic membrane and external ear normal.  Left Ear: Tympanic membrane and external ear normal.  Nose: Nose normal.  Mouth/Throat: Uvula is midline and oropharynx is clear and moist.  NT over left sided scalp, no bruits  Eyes: Conjunctivae and EOM are normal. Pupils are equal, round, and reactive to light.  Discs sharp bilaterally  Neck: Normal range of motion. Neck supple. No thyroid mass and no thyromegaly present.  Cardiovascular: Normal rate, regular rhythm,  normal heart sounds and intact distal pulses.   No murmur heard. Pulmonary/Chest: Effort normal and breath sounds normal.  Neurological: She is alert and oriented to person, place, and time. She has normal reflexes. She displays normal reflexes. No cranial nerve deficit. She exhibits normal muscle tone. Coordination normal.          Assessment & Plan:  1.  Left Temporal Head complaints:  Likely having intermittent muscle spasm of that area of scalp.   No concerning findings on exam. Encouraged good water intake and adequate sleep Work on stressors  2.  Difficulties with teenage daughter:  Referral to Nilda SimmerNatosha Knight, LCSW, for counseling on dealing with her daughter.  Perhaps counseling both mother and daughter.

## 2015-12-22 ENCOUNTER — Telehealth: Payer: Self-pay | Admitting: Licensed Clinical Social Worker

## 2015-12-22 NOTE — Telephone Encounter (Signed)
LCSW called pt after receiving a referral for counseling from Dr. Delrae AlfredMulberry. Pt stated that she is starting a new job this week and did not want to start counseling. She stated that she would call back in the future if she wanted to start counseling.

## 2016-01-22 IMAGING — US US OB FOLLOW-UP
1 series · 12 of 28 positions shown · non-contrast
Comparison: none

[Series 1: us ob follow-up · 0.23mm/px · 12 of 48 slices shown]
[im 2/48]
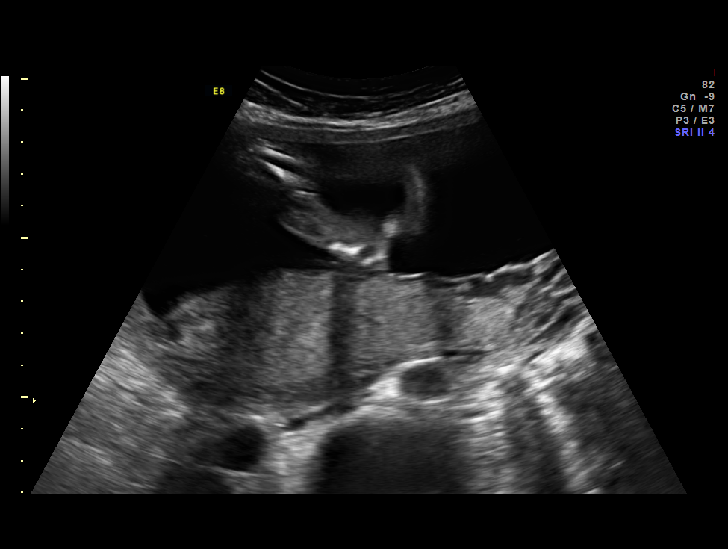
[im 6/48]
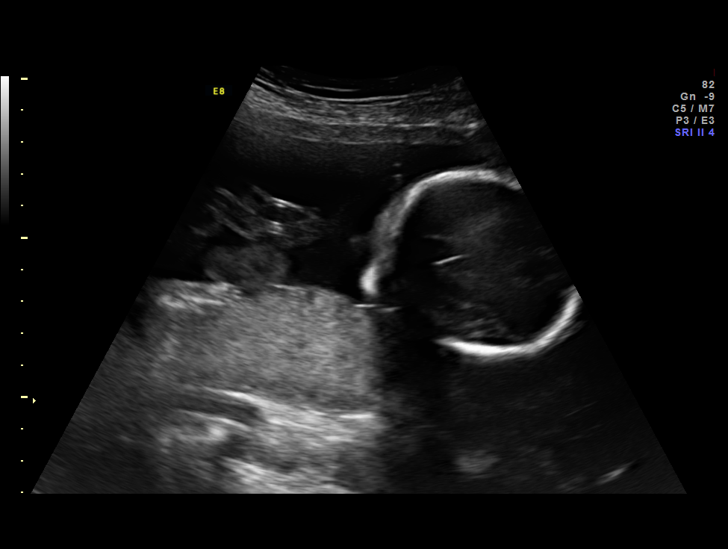
[im 9/48]
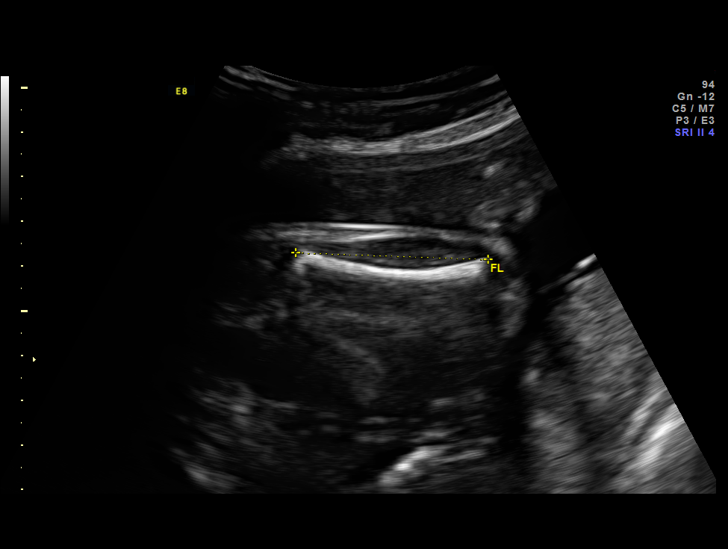
[im 14/48]
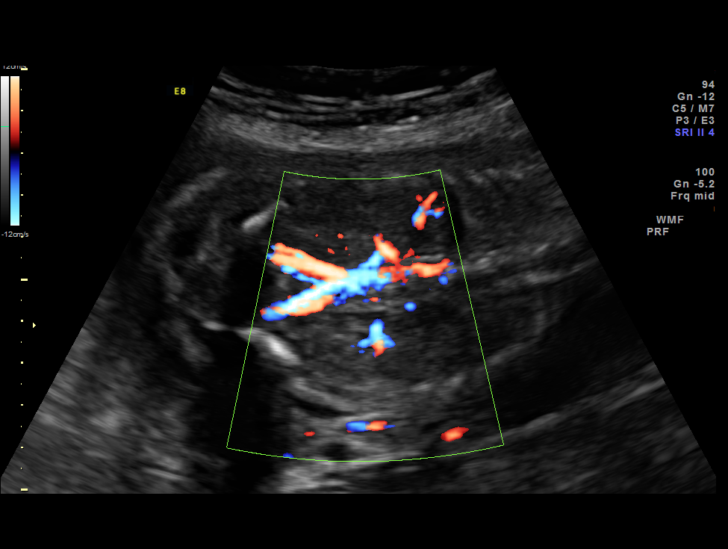
[im 18/48]
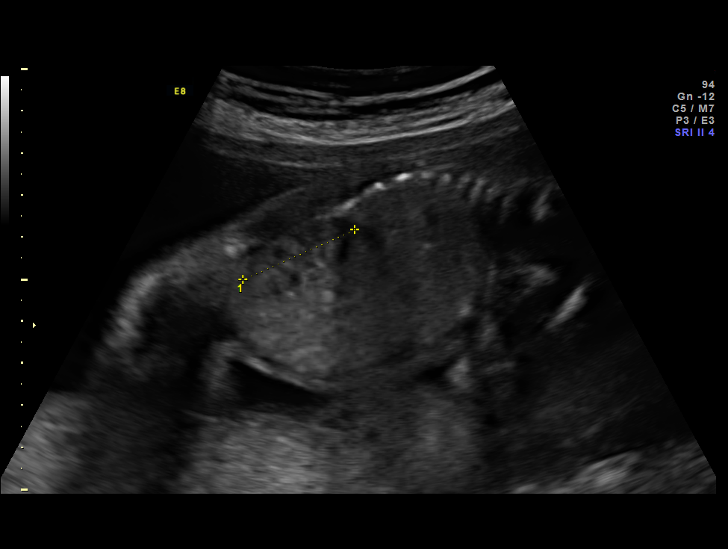
[im 21/48]
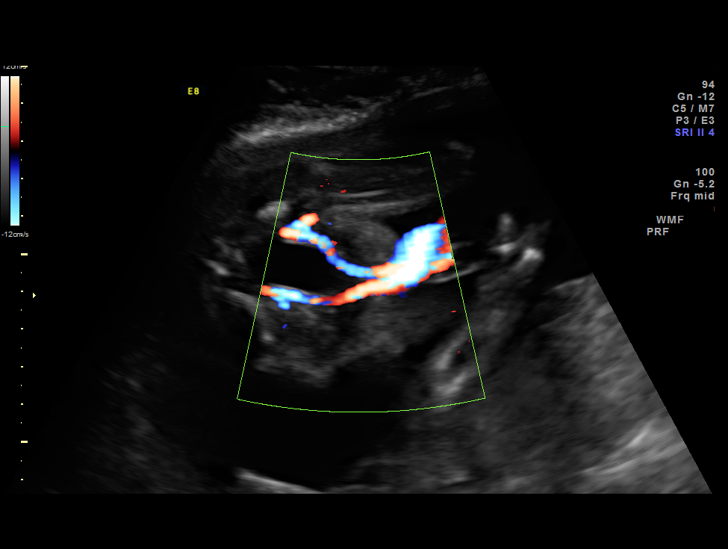
[im 27/48]
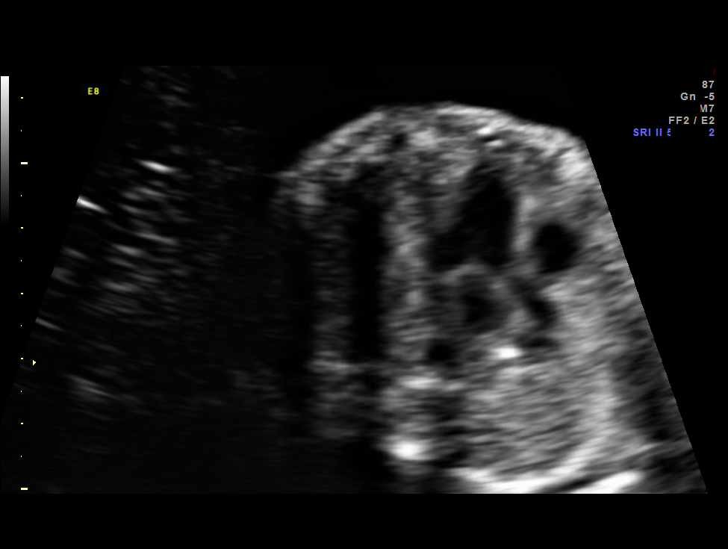
[im 30/48]
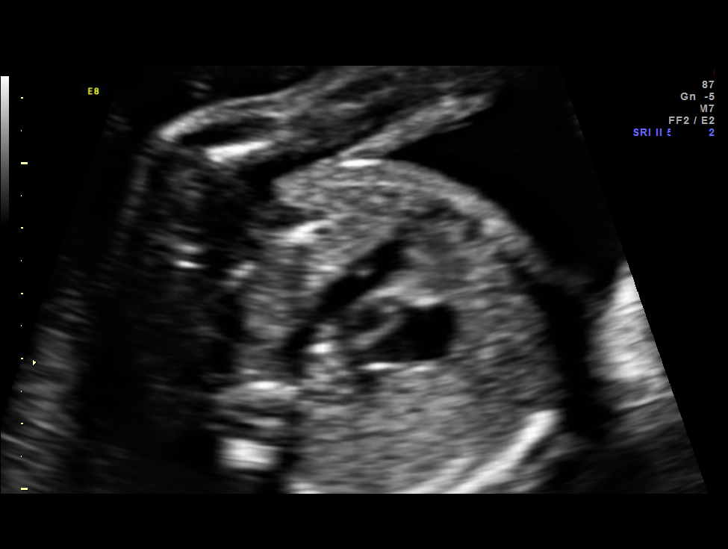
[im 34/48]
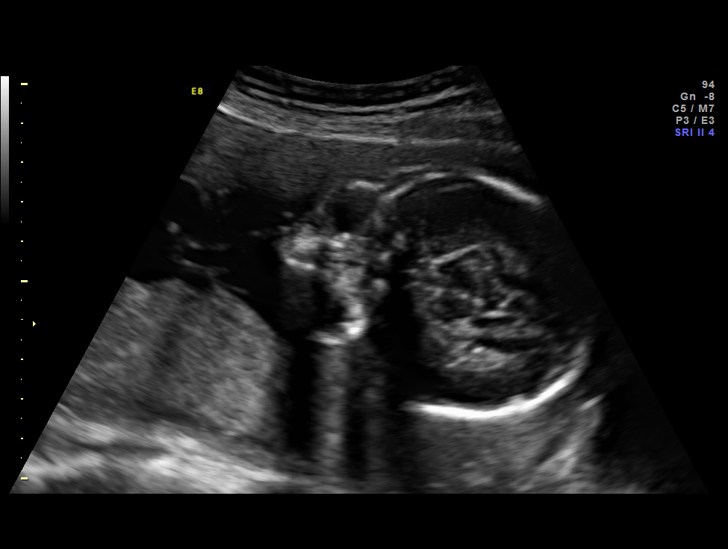
[im 39/48]
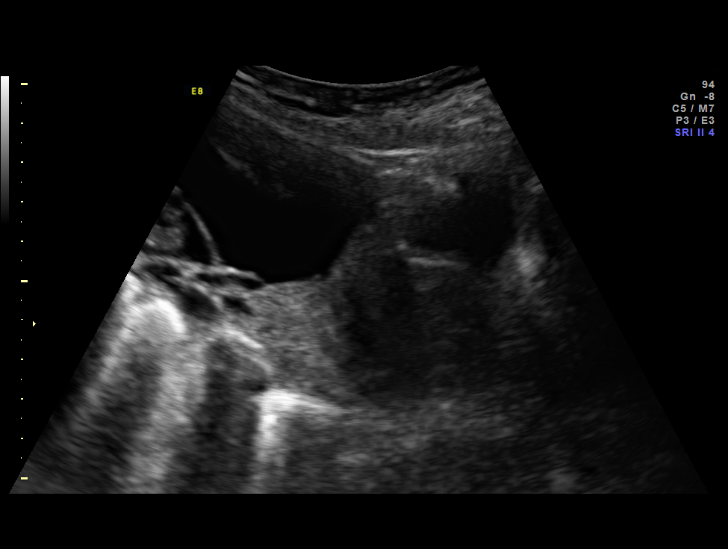
[im 42/48]
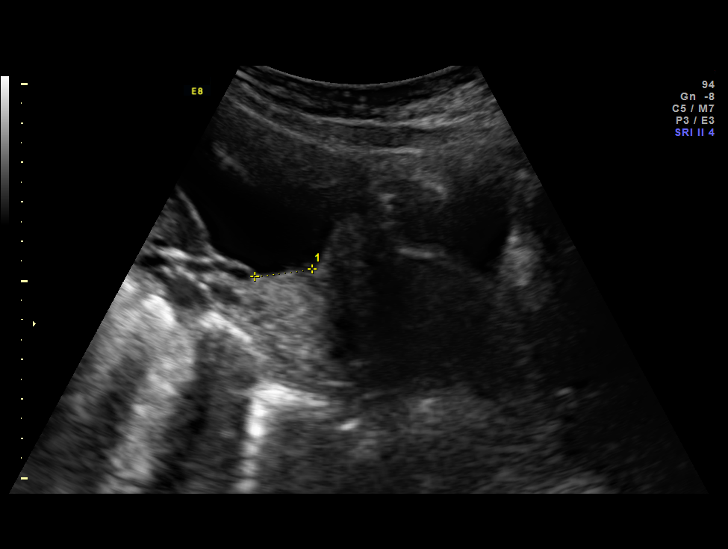
[im 46/48]
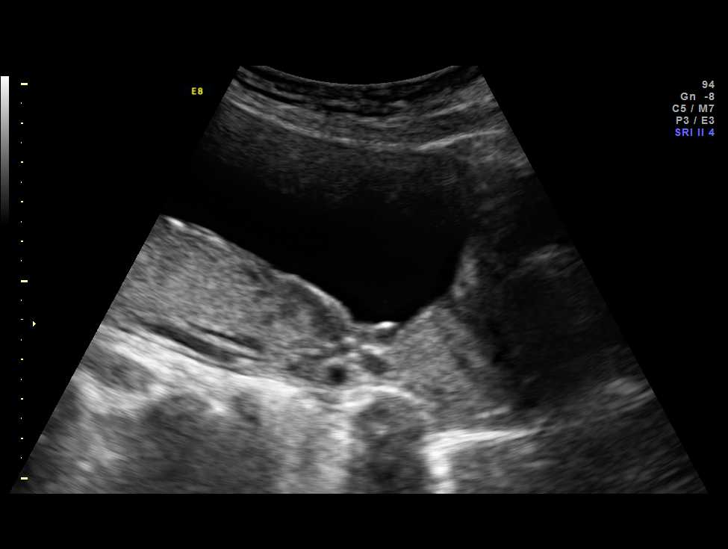

[12 of 28 positions shown; findings below may reference images not displayed]

OBSTETRICS REPORT
                      (Signed Final 09/23/2014 [DATE])

Service(s) Provided

 US OB FOLLOW UP                                       76816.1
Indications

 Detailed fetal anatomic survey                        Z36
 24 weeks gestation of pregnancy
 Advanced maternal age multigravida 35+, second
 trimester; declined YAG; low risk quad screen
 Placenta previa/Low lying: No bleeding
Fetal Evaluation

 Num Of Fetuses:    1
 Fetal Heart Rate:  141                          bpm
 Cardiac Activity:  Observed
 Presentation:      Cephalic
 Placenta:          Posterior, low-lying,
                    cm from int os
 P. Cord            Previously Visualized
 Insertion:

 Amniotic Fluid
 AFI FV:      Subjectively within normal limits
                                             Larg Pckt:     4.9  cm
Biometry

 BPD:     58.8  mm     G. Age:  24w 0d                CI:         72.9   70 - 86
 OFD:     80.7  mm                                    FL/HC:      19.1   18.7 -

 HC:       225  mm     G. Age:  24w 4d       40  %    HC/AC:      1.04   1.05 -

 AC:     216.7  mm     G. Age:  26w 1d       90  %    FL/BPD:     73.1   71 - 87
 FL:        43  mm     G. Age:  24w 1d       31  %    FL/AC:      19.8   20 - 24
 HUM:     39.9  mm     G. Age:  24w 2d       42  %

 Est. FW:     774  gm    1 lb 11 oz      68  %
Gestational Age

 LMP:           24w 2d        Date:  04/06/14                 EDD:   01/11/15
 U/S Today:     24w 5d                                        EDD:   01/08/15
 Best:          24w 2d     Det. By:  LMP  (04/06/14)          EDD:   01/11/15
Anatomy

 Cranium:          Appears normal         Aortic Arch:      Previously seen
 Fetal Cavum:      Appears normal         Ductal Arch:      Previously seen
 Ventricles:       Appears normal         Diaphragm:        Appears normal
 Choroid Plexus:   Previously seen        Stomach:          Appears normal, left
                                                            sided
 Cerebellum:       Previously seen        Abdomen:          Appears normal
 Posterior Fossa:  Previously seen        Abdominal Wall:   Appears nml (cord
                                                            insert, abd wall)
 Nuchal Fold:      Previously seen        Cord Vessels:     Appears normal (3
                                                            vessel cord)
 Face:             Appears normal         Kidneys:          Appear normal
                   (orbits and profile)
 Lips:             Appears normal         Bladder:          Appears normal
 Heart:            Appears normal         Spine:            Previously seen
                   (4CH, axis, and
                   situs)
 RVOT:             Appears normal         Lower             Previously seen
                                          Extremities:
 LVOT:             Appears normal         Upper             Previously seen
                                          Extremities:

 Other:  Male gender. Heels and 5th digit previously seen.
Cervix Uterus Adnexa

 Cervical Length:    3        cm

 Cervix:       Normal appearance by transabdominal scan. Appears
               closed, without funnelling.
Impression

 SIUP at 24+2 weeks
 Normal interval anatomy; anatomic survey complete
 Normal amniotic fluid volume
 Appropriate interval growth with EFW at the 68th %tile
 Inferior edge of posterior placenta measured 
 1.5 cms from
 internal os i.e. low-lying placenta
Recommendations

 Follow-up ultrasound in 6 weeks to reassess placental
 location

## 2016-03-04 IMAGING — US US OB FOLLOW-UP
1 series · 12 of 28 positions shown · non-contrast
Comparison: none

[Series 1: us ob follow-up · 0.25mm/px · 65 acquisitions, 12 frames shown]
[im 3/65]
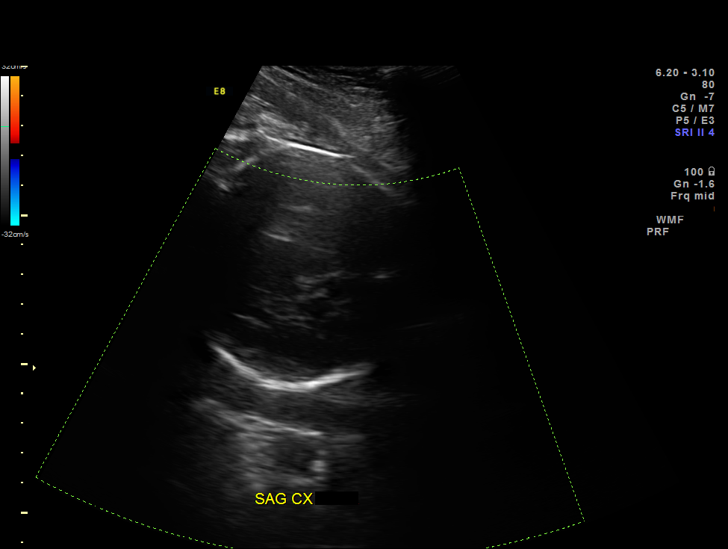
[im 8/65]
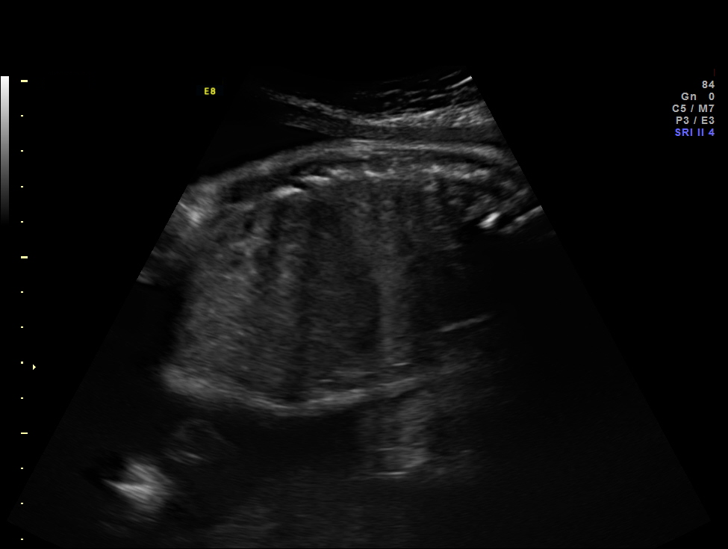
[im 12/65]
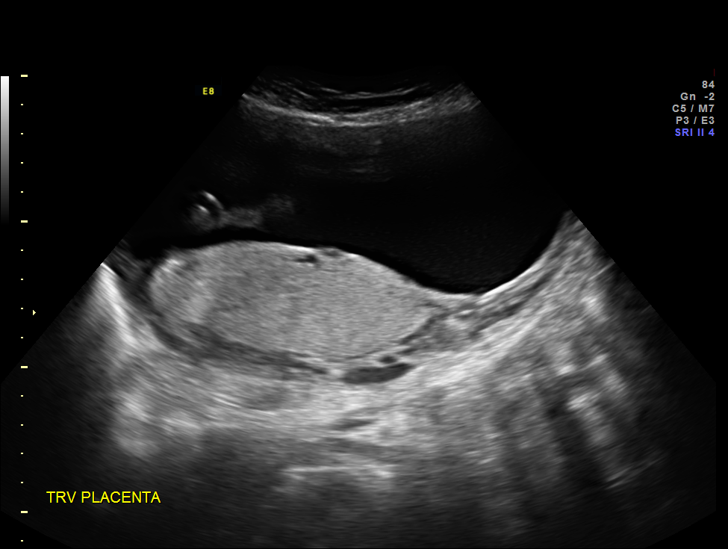
[im 19/65]
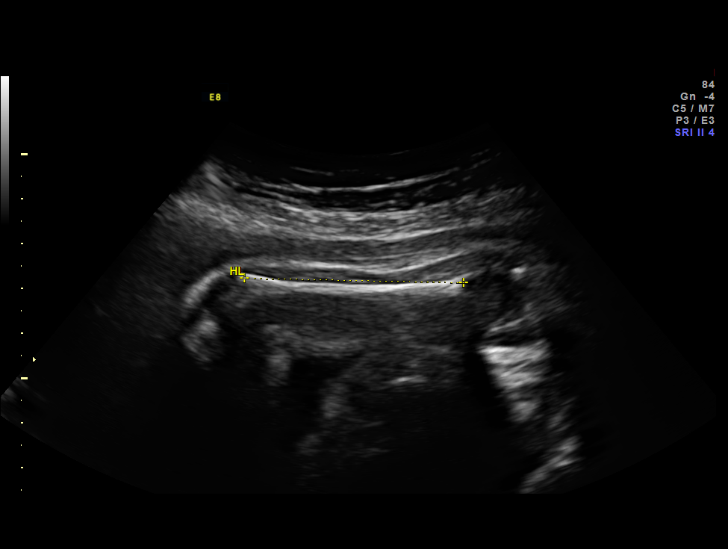
[im 24/65]
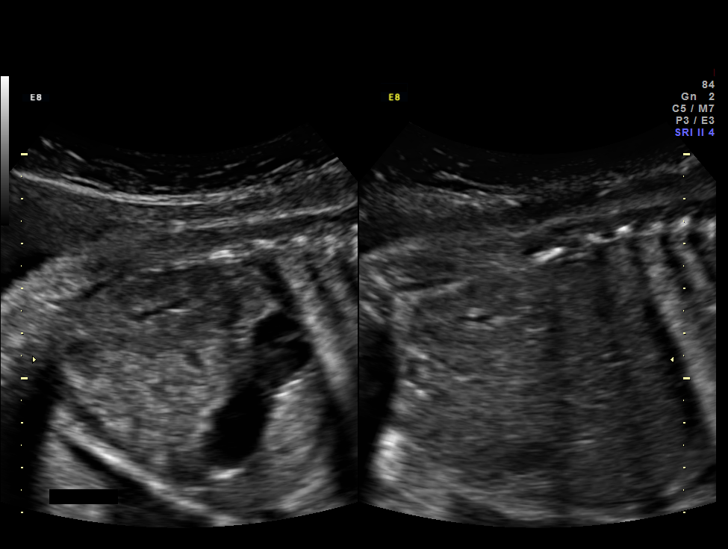
[im 29/65]
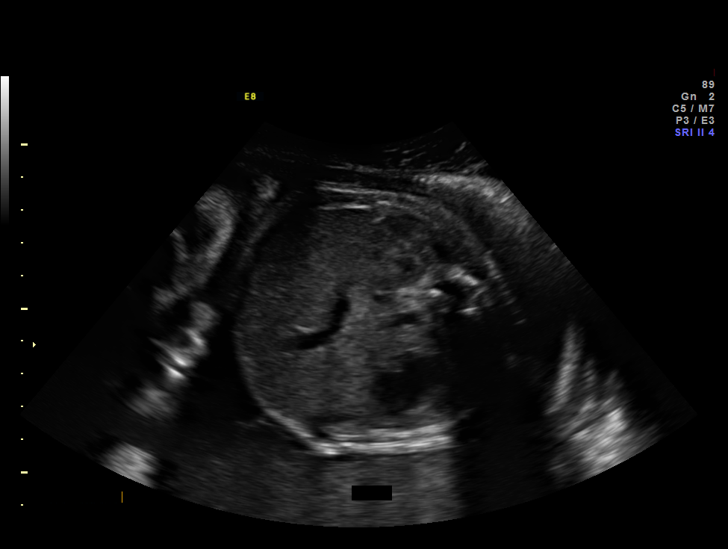
[im 36/65]
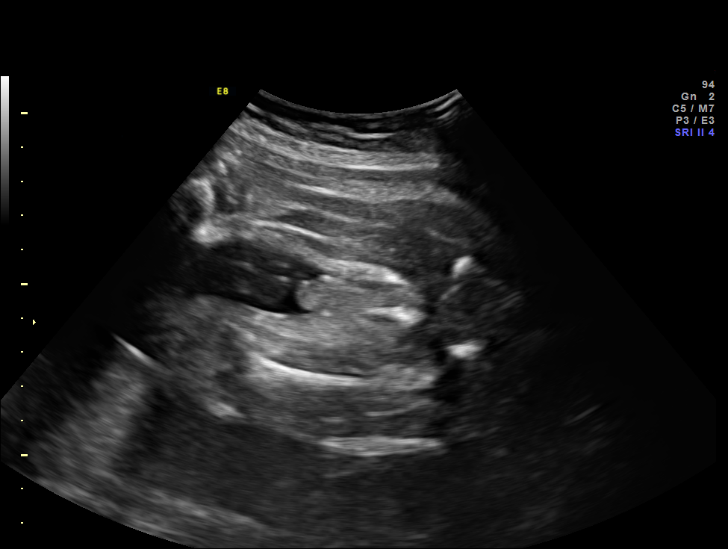
[im 41/65]
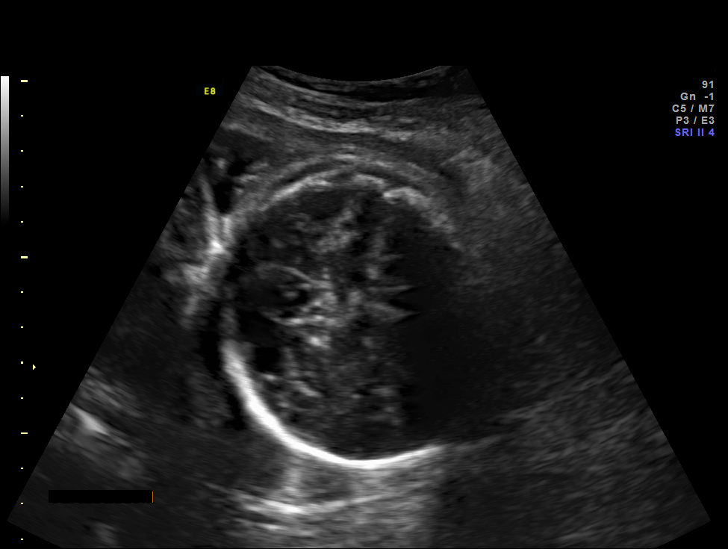
[im 46/65]
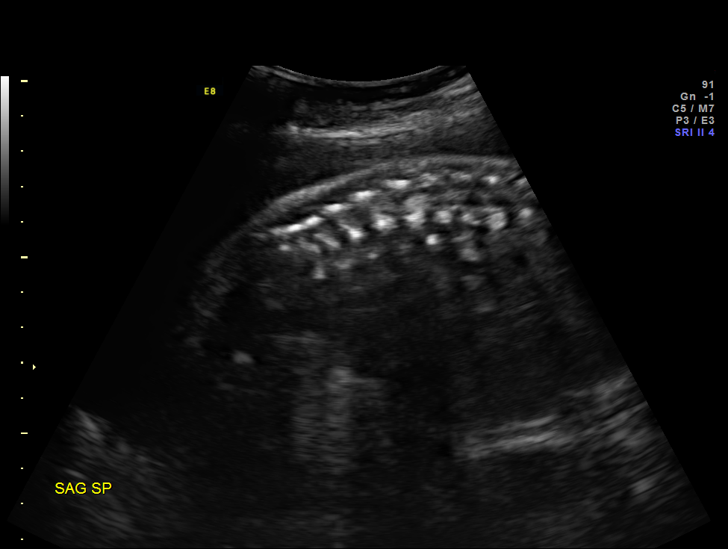
[im 53/65]
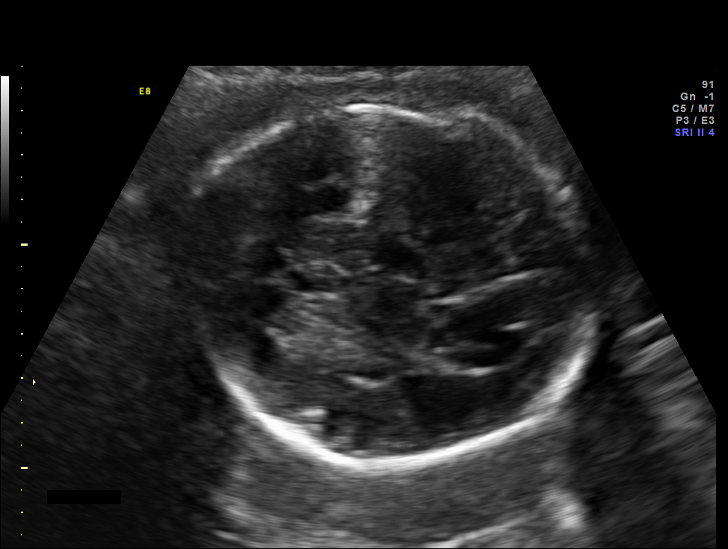
[im 57/65]
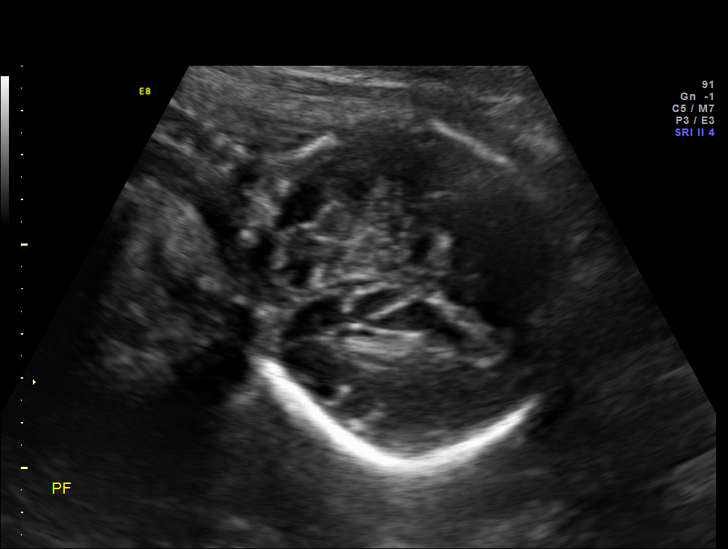
[im 62/65]
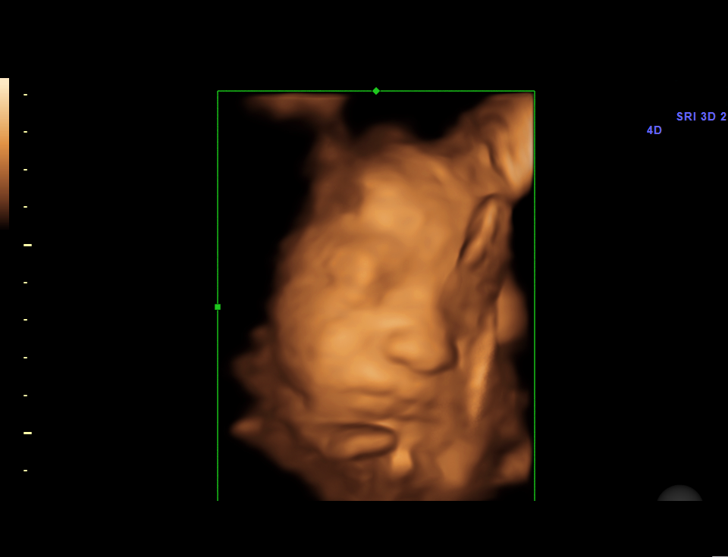

[12 of 28 positions shown; findings below may reference images not displayed]

OBSTETRICS REPORT
                      (Signed Final 11/04/2014 [DATE])

Service(s) Provided

 US OB FOLLOW UP                                       76816.1
Indications

 Placenta previa/Low lying: No bleeding
 30 weeks gestation of pregnancy
 Advanced maternal age multigravida 35+, third
 trimester; declined AUJLA; low risk quad screen
Fetal Evaluation

 Num Of Fetuses:    1
 Fetal Heart Rate:  136                          bpm
 Cardiac Activity:  Observed
 Presentation:      Cephalic
 Placenta:          Posterior, above cervical
                    os
 P. Cord            Visualized, central
 Insertion:

 Amniotic Fluid
 AFI FV:      Mild polyhydramnios
 AFI Sum:     27.16   cm     > 97  %Tile     Larg Pckt:    8.49  cm
 RUQ:   8.49    cm   RLQ:    7.04   cm    LUQ:   5.53    cm   LLQ:    6.1    cm
Biophysical Evaluation

 Amniotic F.V:   Increased                  F. Tone:        Observed
 F. Movement:    Observed                   Score:          [DATE]
 F. Breathing:   Observed
Biometry

 BPD:     77.2  mm     G. Age:  31w 0d                CI:         80.8   70 - 86
 OFD:     95.6  mm                                    FL/HC:      19.6   19.2 -

 HC:       277  mm     G. Age:  30w 2d       17  %    HC/AC:      1.02   0.99 -

 AC:     272.2  mm     G. Age:  31w 2d       75  %    FL/BPD:     70.5   71 - 87
 FL:      54.4  mm     G. Age:  28w 5d        7  %    FL/AC:      20.0   20 - 24
 HUM:       49  mm     G. Age:  28w 6d       21  %

 Est. FW:    4564  gm      3 lb 7 oz     55  %
Gestational Age

 LMP:           30w 2d        Date:  04/06/14                 EDD:   01/11/15
 U/S Today:     30w 2d                                        EDD:   01/11/15
 Best:          30w 2d     Det. By:  LMP  (04/06/14)          EDD:   01/11/15
Anatomy

 Cranium:          Appears normal         Aortic Arch:      Previously seen
 Fetal Cavum:      Previously seen        Ductal Arch:      Previously seen
 Ventricles:       Appears normal         Diaphragm:        Appears normal
 Choroid Plexus:   Appears normal         Stomach:          Appears normal, left
                                                            sided
 Cerebellum:       Appears normal         Abdomen:          Appears normal
 Posterior Fossa:  Appears normal         Abdominal Wall:   Previously seen
 Nuchal Fold:      Previously seen        Cord Vessels:     Previously seen
 Face:             Appears normal         Kidneys:          Appear normal
                   (orbits and profile)
 Lips:             Appears normal         Bladder:          Appears normal
 Heart:            Previously seen        Spine:            Appears normal
 RVOT:             Appears normal         Lower             Previously seen
                                          Extremities:
 LVOT:             Appears normal         Upper             Previously seen
                                          Extremities:

 Other:  Male gender. Heels and 5th digit previously seen.
Cervix Uterus Adnexa

 Cervix:       Not visualized (advanced GA >27wks)
 Uterus:       No abnormality visualized.
 Cul De Sac:   No free fluid seen.

 Left Ovary:    Not visualized.
 Right Ovary:   Within normal limits.
 Adnexa:     No abnormality visualized.
Impression

 SIUP at 30+2  weeks
 Normal interval fetal anatomy; normal stomach
 Mild polyhydramnios
 Measurements consistent with 55th %tile
 BPP [DATE]
 TA views: fetal head directly applied to cervix; placenta edge
 measured > 2 cms from interval os but optimally visualized
Recommendations

 Follow-up ultrasound in 2 weeks to reassess AFV and
 placental edge; BPP if remains increased (poly)

## 2016-03-20 IMAGING — US US OB LIMITED
1 series · 13 of 28 positions shown · non-contrast
Comparison: none

[Series 1: us ob limited · 0.28mm/px · 36 acquisitions, 13 frames shown]
[im 2/36]
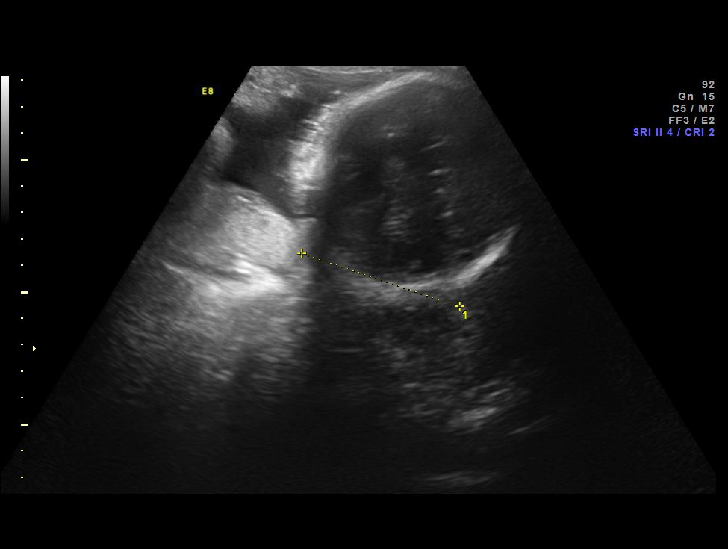
[im 4/36]
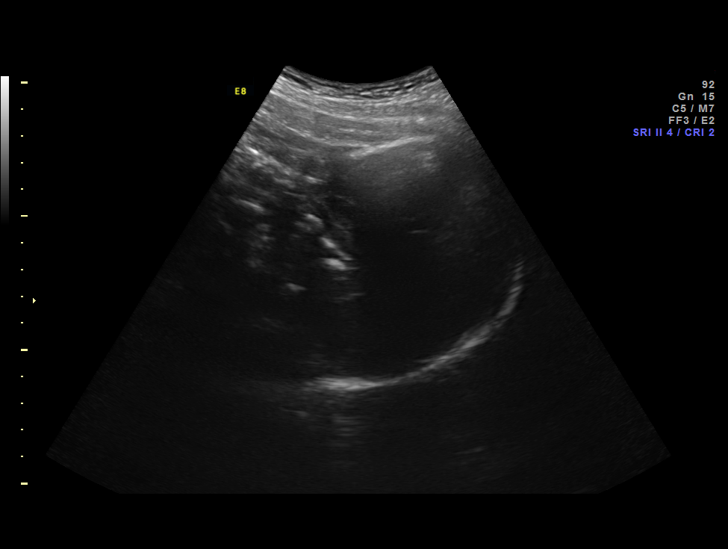
[im 7/36]
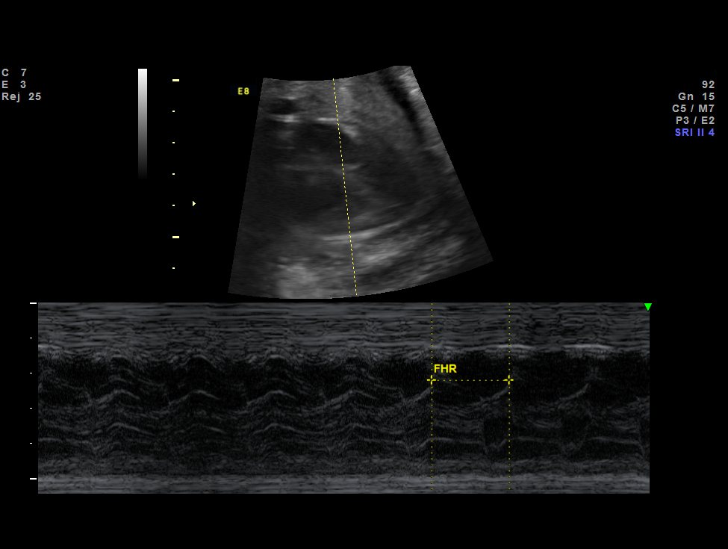
[im 10/36]
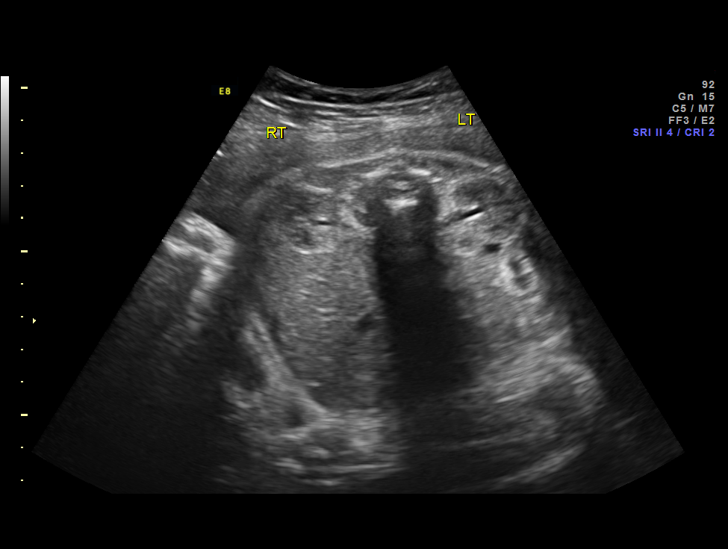
[im 12/36]
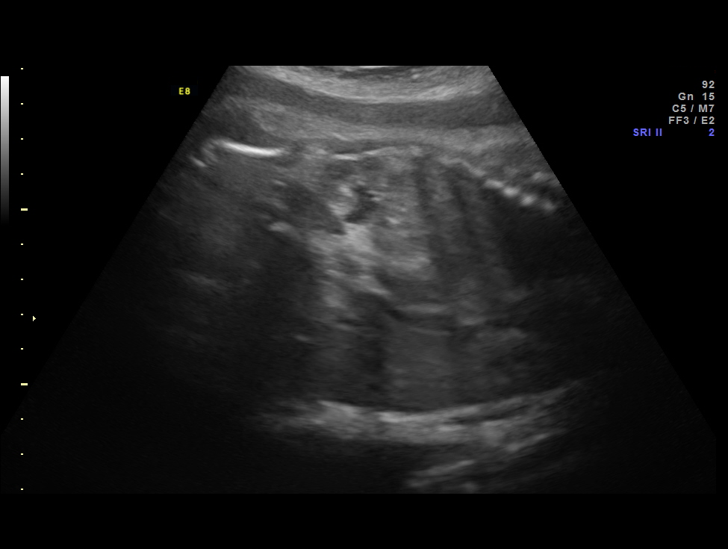
[im 15/36]
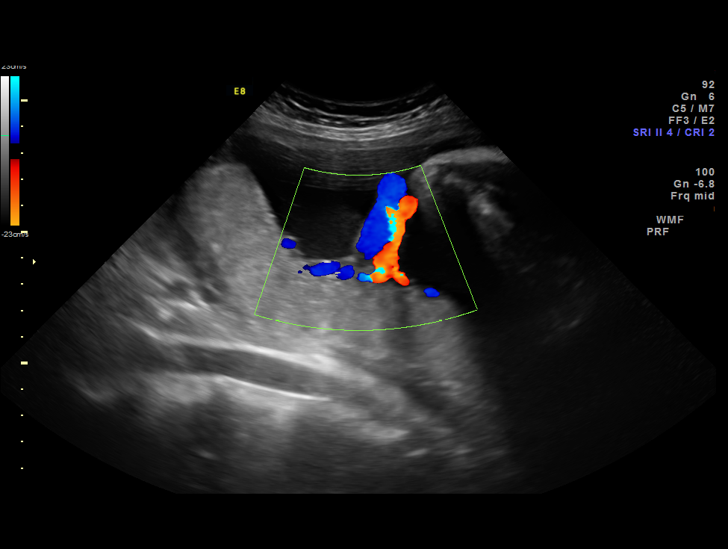
[im 19/36]
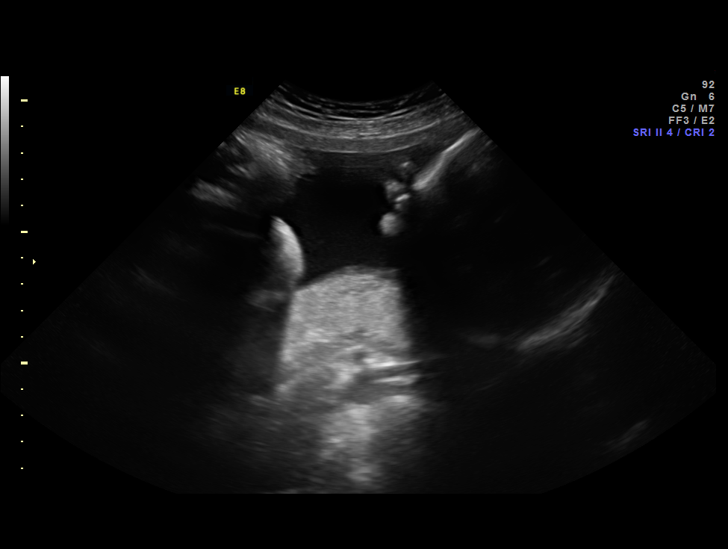
[im 21/36]
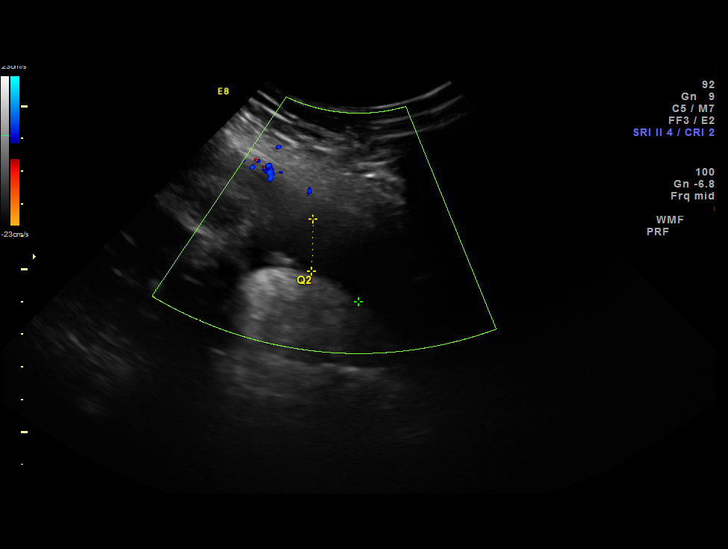
[im 24/36]
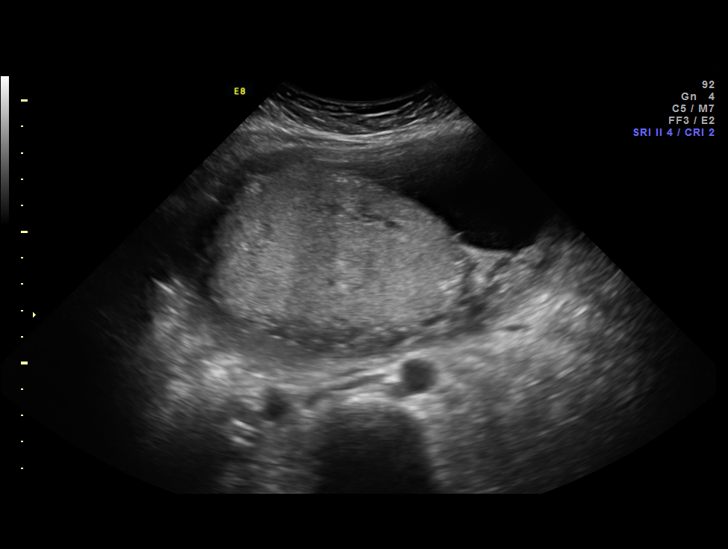
[im 26/36]
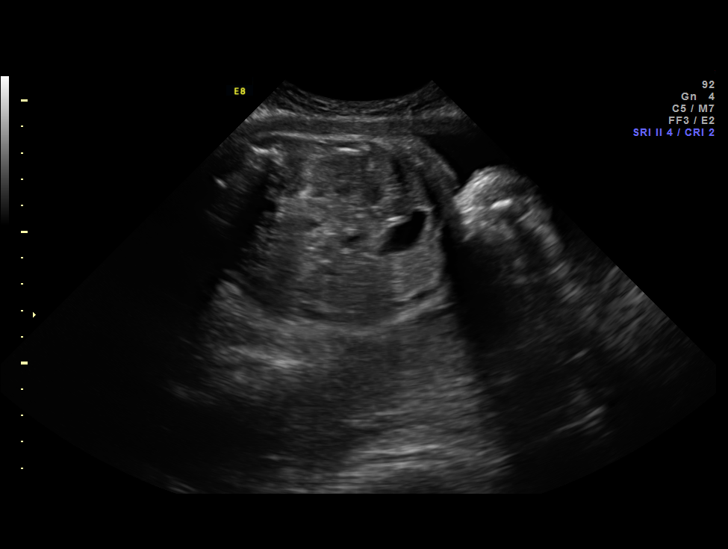
[im 29/36]
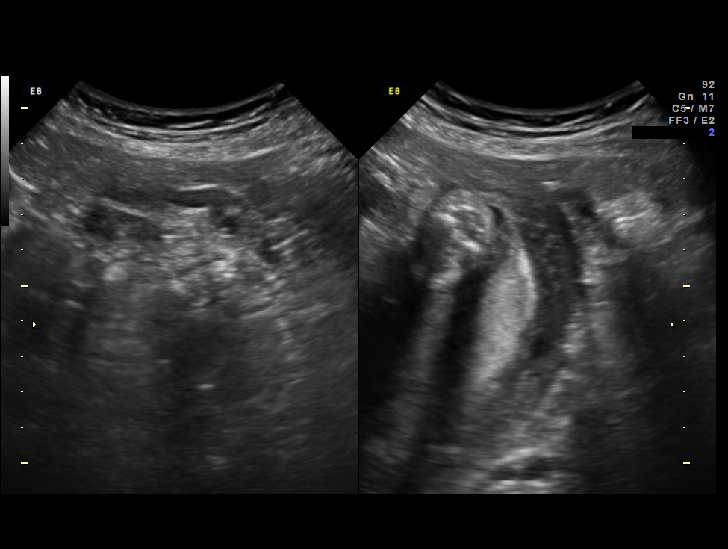
[im 32/36]
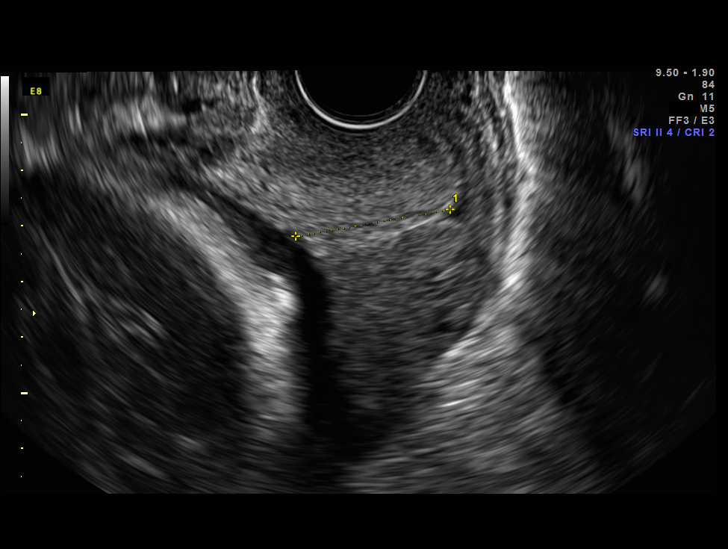
[im 34/36]
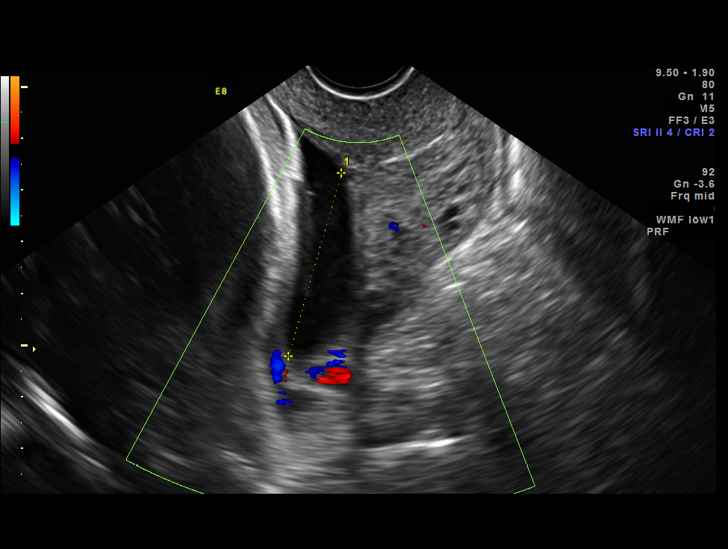

[13 of 28 positions shown; findings below may reference images not displayed]

OBSTETRICS REPORT
                    (Corrected Final 11/26/2014 [DATE])

Service(s) Provided

 [HOSPITAL]                                         76815.0
 US MFM OB TRANSVAGINAL                                76817.2
Indications

 32 weeks gestation of pregnancy
 Polyhydramnios
 Placenta previa/Low lying: No bleeding
 Advanced maternal age multigravida 35+, third
 trimester; declined JOSHJAX; low risk quad screen
Fetal Evaluation

 Num Of Fetuses:    1
 Fetal Heart Rate:  130                          bpm
 Cardiac Activity:  Observed
 Placenta:          Posterior, above cervical
                    os
 P. Cord            Visualized, central
 Insertion:

 Comment:    Placenta appears to be 3.69 cm from cervical internal
             office.

 Amniotic Fluid
 AFI FV:      Subjectively within normal limits
 AFI Sum:     12.85   cm       39  %Tile     Larg Pckt:    4.48  cm
 RUQ:   4.48    cm   RLQ:    4.28   cm    LUQ:   1.6     cm   LLQ:    2.49   cm
Gestational Age

 LMP:           32w 4d        Date:  04/06/14                 EDD:   01/11/15
 Best:          32w 4d     Det. By:  LMP  (04/06/14)          EDD:   01/11/15
Cervix Uterus Adnexa

 Cervical Length:    2.82     cm

 Cervix:       Normal appearance by transvaginal scan
 Uterus:       No abnormality visualized.
 Cul De Sac:   No free fluid seen.

 Left Ovary:    No adnexal mass visualized.
 Right Ovary:   No adnexal mass visualized.
 Adnexa:     No adnexal mass visualized.
Impression

 Single IUP at 32w 4d
 Limited ultrasound performed for amniotic fluid volume
 assessment.
 Normal amniotic fluid volume (AFI 12.8 cm)

 TVUS - cervical length 2.8 cm.  A posterior placenta is noted
 (resolved low lying placenta)
Recommendations

 Follow-up ultrasounds as clinically indicated.

                 Attending Physician, GRACIANO

## 2019-08-12 ENCOUNTER — Ambulatory Visit: Payer: Self-pay | Admitting: Internal Medicine

## 2020-03-11 ENCOUNTER — Ambulatory Visit: Payer: Self-pay | Admitting: Family Medicine

## 2020-03-20 ENCOUNTER — Other Ambulatory Visit: Payer: Self-pay | Admitting: *Deleted

## 2020-03-26 ENCOUNTER — Other Ambulatory Visit: Payer: Self-pay

## 2020-03-26 DIAGNOSIS — N644 Mastodynia: Secondary | ICD-10-CM

## 2020-03-31 ENCOUNTER — Encounter: Payer: Self-pay | Admitting: Internal Medicine

## 2020-03-31 ENCOUNTER — Other Ambulatory Visit: Payer: Self-pay

## 2020-03-31 ENCOUNTER — Ambulatory Visit: Payer: Self-pay | Attending: Family Medicine | Admitting: Family

## 2020-03-31 VITALS — BP 110/73 | HR 76 | Temp 98.1°F | Resp 16 | Ht 59.5 in | Wt 127.0 lb

## 2020-03-31 DIAGNOSIS — R1310 Dysphagia, unspecified: Secondary | ICD-10-CM

## 2020-03-31 DIAGNOSIS — Z789 Other specified health status: Secondary | ICD-10-CM

## 2020-03-31 DIAGNOSIS — Z7689 Persons encountering health services in other specified circumstances: Secondary | ICD-10-CM

## 2020-03-31 NOTE — Progress Notes (Signed)
Patient ID: Joanna Vega, female    DOB: Sep 22, 1976  MRN: [id number]  CC: New Patient (Initial Visit)  Subjective: Joanna Vega is a 44 y.o. female with history of ovarian cyst who present to establish care.  1. THROAT PROBLEM:  Today patient reports she is not having any throat problems and no throat pain. Reports throat problem began worsening 1 month ago. Reports she has been dealing with this issue for 3 years and has never seen a provider about this. Reports it hurts when she swallows and that she chokes sometimes when eating and drinking. Reports she had a sore throat and runny nose 1 month ago when throat problem began to worsen but doesn't have any today. Reports she has improved since 1 month ago but still has some level swallowing trouble almost everyday. Reports overall she is able to eat and drink as normal. Denies acid reflux.  Denies cough, coughing up blood, headache, vision change, earache, neck swelling, neck tenderness, tongue swelling, tongue tenderness, nausea, and vomiting. Reports eating makes it worse. Reports she purchased Amoxicillin from someone and it helped. Reports she took 1 Amoxicillin pill for 5 days and it helped. Reports she has tried drinking warm tea and it helped. Request appointment with specialist to look at her throat.  2. ESTABLISH CARE: Reports her last primary physician was Dr. Delrae Alfred at the [facility]. Reports she does not want to return there for care and would like to establish care with a provider at this office. Reports she had a physical exam, PAP, and blood work about 3 months ago. Reports the only medications she takes are multivitamins prescribed by the health department.  Patient Active Problem List   Diagnosis Date Noted  . OVARIAN CYST 12/07/2006     Current Outpatient Medications on File Prior to Visit  Medication Sig Dispense Refill  . medroxyPROGESTERone (DEPO-PROVERA) 150 MG/ML injection  Inject 150 mg into the muscle every 3 (three) months.     . Prenatal Vit-Fe Fumarate-FA (PRENATAL VITAMIN PO) Take by mouth. (Patient not taking: Reported on 03/31/2020)     No current facility-administered medications on file prior to visit.    No Known Allergies  Social History   Socioeconomic History  . Marital status: Married    Spouse name: Rolm Baptise  . Number of children: 3  . Years of education: 4  . Highest education level: Not on file  Occupational History  . Occupation: Housewife  Tobacco Use  . Smoking status: Never Smoker  . Smokeless tobacco: Never Used  Substance and Sexual Activity  . Alcohol use: No    Alcohol/week: 0.0 standard drinks  . Drug use: No  . Sexual activity: Yes    Birth control/protection: Injection    Comment: Depo Provera--[facility]  Other Topics Concern  . Not on file  Social History Narrative   Originally from [place]   Came to [place] [employer] [place]. In 2007      Lives at home with husband and 3 children   Social Determinants of Health   Financial Resource Strain:   . Difficulty of Paying Living Expenses:   Food Insecurity:   . Worried About Programme researcher, broadcasting/film/video in the Last Year:   . Barista in the Last Year:   Transportation Needs:   . Freight forwarder (Medical):   Marland Kitchen Lack of Transportation (Non-Medical):   Physical Activity:   . Days of Exercise per Week:   . Minutes of Exercise per Session:  Stress:   . Feeling of Stress :   Social Connections:   . Frequency of Communication with Friends and Family:   . Frequency of Social Gatherings with Friends and Family:   . Attends Religious Services:   . Active Member of Clubs or Organizations:   . Attends Banker Meetings:   Marland Kitchen Marital Status:   Intimate Partner Violence:   . Fear of Current or Ex-Partner:   . Emotionally Abused:   Marland Kitchen Physically Abused:   . Sexually Abused:     Family History  Problem Relation Age of Onset  . Asthma Mother   . Alcohol abuse Father      Past Surgical History:  Procedure Laterality Date  . NO PAST SURGERIES      ROS: Review of Systems Negative except as stated above  PHYSICAL EXAM: BP 110/73   Pulse 76   Temp 98.1 F (36.7 C)   Resp 16   Ht 4' 11.5" (1.511 m)   Wt 127 lb (57.6 kg)   SpO2 99%   BMI 25.22 kg/m   Physical Exam  General appearance - alert, well appearing, and in no distress and oriented to person, place, and time Mental status - alert, oriented to person, place, and time, normal mood, behavior, speech, dress, motor activity, and thought processes Eyes - pupils equal and reactive, extraocular eye movements intact Ears - bilateral TM's and external ear canals normal Nose - normal and patent, no erythema, discharge or polyps and normal nontender sinuses  Mouth - mucous membranes moist, pharynx normal without lesions, dental hygiene poor, edentulous and tongue normal Neck - supple, no significant adenopathy, thyroid exam: thyroid is normal in size without nodules or tenderness Lymphatics - no palpable lymphadenopathy, no hepatosplenomegaly Chest - clear to auscultation, no wheezes, rales or rhonchi, symmetric air entry, no tachypnea, retractions or cyanosis Heart - normal rate, regular rhythm, normal S1, S2, no murmurs, rubs, clicks or gallops  ASSESSMENT AND PLAN: 1. Swallowing problem: -Patient without swallowing issues today and not in distress. Denies chest pain, palpitations, and shortness of breath. -Patient reports she has a swallowing problem which has been going on for about 3 years and has never seen a provider for this. Reports swallowing problem was worse 1 month ago but since then has improved.  -Reports when she has swallowing problems it hurts to swallows, causes trouble with eating, causes choking sometimes. Denies cough, coughing up blood, headache, vision change, earache, neck swelling, neck tenderness, tongue swelling, tongue tenderness, nausea, vomiting, and history of acid  reflux. -Referral to ENT, per patient request, for further evaluation and management. -Offered patient [place] financial discount/orange card application. Patient stated she will get this submitted soon. - Ambulatory referral to ENT  2. Encounter to establish care: -Patient to establish care with primary provider. Patient reports last provider was Dr. Delrae Alfred at [facility].  -Patient reports she just received a physical examination, blood work, and PAP a few months ago. Request for patient to sign release of information so that new primary provider will have access to this information. Reports the only medications she takes are prescribed multivitamins. Only history of note is an ovarian cyst.  -Patient with right breast pain. An order for ultrasound placed by Dr. Jolayne Panther on 03/26/2020. Patient has not gotten this done yet. -Follow-up with primary physician as needed.   3. Language barrier: -Patient accompanied by [facility] interpreter named Gigi.   Patient was given the opportunity to ask questions.  Patient verbalized understanding of the plan and was able to repeat key elements of the plan. Patient was given clear instructions to go to Emergency Department or return to medical center if symptoms don't improve, worsen, or new problems develop.The patient verbalized understanding.   Camillia Herter, NP

## 2020-03-31 NOTE — Patient Instructions (Addendum)
Remisin a un otorrinolaringlogo por problemas para tragar. Haga un seguimiento con el mdico de cabecera segn sea necesario. Solicite el descuento financiero / tarjeta naranja de [place]. Solicite una beca para mamografas.  Referral to ENT for swallowing problem. Follow-up with primary physician as needed. Apply for [facility] financial discount/orange card. Apply for Mammo gram Scholarship. Disfagia Dysphagia  La disfagia es un problema para tragar. Esta afeccin se produce cuando los slidos y lquidos se pegan a la garganta de Physiological scientist persona en su recorrido hasta el estmago o cuando el alimento tarda ms tiempo del habitual en llegar al Teachers Insurance and Annuity Association. Puede tener problemas para tragar alimentos, lquidos o ambos. Tambin puede tener dolor cuando intenta tragar. Puede llevarle ms tiempo y Information systems manager. Cules son las causas? Esta afeccin puede ser causada por lo siguiente:  Problemas musculares. Es posible que los msculos le dificulten el paso de los alimentos y lquidos por esfago, el tubo que conecta la boca con el [place].  Obstrucciones. Puede tener lceras, tejido cicatricial o inflamacin que bloquea el paso normal de los alimentos y lquidos. Las causas de estos problemas incluyen las siguientes: ? Reflujo cido desde el estmago hacia el esfago (reflujo gastroesofgico). ? Infecciones. ? Radioterapia para Animator. ? Medicamentos que se toman sin la cantidad suficiente de lquido para Hydrologist.  Accidente cerebrovascular. Puede afectar los nervios y hacer que sea difcil tragar.  Problemas neurolgicos. Estos impiden que se enven seales a los msculos del esfago para que se aprieten (contraigan) y [place] lo que traga hacia el [place].  Globo farngeo. Este es un problema comn que consiste en sentir como si hubiera una obstruccin en la garganta o una sensacin de problema para tragar incluso si no hay nada mal en las vas de  deglucin.  Algunas afecciones, tales como parlisis cerebral o enfermedad de Parkinson. Cules son los signos o sntomas? Los sntomas frecuentes de esta afeccin incluyen los siguientes:  Una sensacin de que los slidos o lquidos se traban en la garganta antes de llegar al estmago.  Dolor al tragar.  Tos o arcadas cuando intenta tragar. Otros sntomas pueden incluir los siguientes:  Que los alimentos vuelvan del estmago a la boca (regurgitacin).  Ruidos provenientes de Administrator.  Molestia en el pecho al tragar.  Sensacin de saciedad cuando traga.  Babeo, especialmente cuando la garganta est obstruida.  Acidez estomacal. Cmo se diagnostica? Este trastorno puede diagnosticarse mediante:  Radiografa de bario. En esta prueba, tragar un lquido blanco que se pega en la parte interna del esfago. Luego se toman radiografas.  Endoscopa. En esta prueba, se inserta un telescopio flexible por la garganta para ver el esfago y Investment banker, corporate.  Exploracin por tomografa computarizada (TC) y Hotel manager (RM). Cmo se trata? El tratamiento de la disfagia depende de la causa de esta afeccin, tales como:  Si la causa de la disfagia es el reflujo cido o una infeccin podrn indicarle medicamentos. Estos pueden incluir antibiticos y medicamentos para la Merchant navy officer.  Si la causa de la disfagia son problemas en los msculos, ser necesario seguir una terapia para fortalecer estos msculos que intervienen en la deglucin. Puede tener que hacer ejercicios especficos para Chief Operating Officer o Paramedic.  Si la causa es una obstruccin o un bulto, se realizarn procedimientos para remover la obstruccin. Es posible que necesite [place] y [place] sonda de alimentacin. Puede necesitar hacer cambios en la dieta. Pida instrucciones especficas a su mdico. Siga  estas instrucciones en su casa: Medicamentos  [employer] de venta libre y los  recetados solamente como se lo haya indicado el mdico.  Si le recetaron un antibitico, tmelo como se lo haya indicado el mdico. No deje de tomar los antibiticos aunque comience a Sports administrator. Comida y bebida   Siga los cambios en la dieta como se lo haya indicado el mdico.  Trabaje con un especialista en alimentacin y nutricin (nutricionista) para crear un plan de alimentacin que le ayude a obtener los nutrientes que necesita a fin de mantenerse sano.  Coma alimentos blandos que sean fciles de tragar.  Corte los alimentos en trozos pequeos y coma lentamente. Ingiera bocados pequeos.  Mantngase sentado y erguido para comer y beber.  No consuma alcohol ni cafena. Si necesita ayuda para dejar de fumar, consulte al [employer]. Instrucciones generales  Controle su peso todos los [place] para asegurarse de que no est perdiendo [place].  No consuma ningn producto que contenga nicotina o tabaco, como cigarrillos, cigarrillos electrnicos y tabaco de Higher education careers adviser. Si necesita ayuda para dejar de fumar, consulte al mdico.  Concurra a todas las visitas de seguimiento como se lo haya indicado el mdico. Esto es importante. Comunquese con un mdico si:  [place] porque no puede tragar.  Tose al beber lquidos.  Tose y Optometrist comida parcialmente digerida. Busque ayuda de inmediato si:  No puede tragar saliva.  Tiene dificultad para respirar, fiebre o ambas.  Tiene voz ronca y tambin dificultad para tragar. Resumen  La disfagia es un problema para tragar. Esta afeccin se produce cuando los slidos y lquidos se quedan en la garganta de una persona en su recorrido Research scientist (medical). Es posible que tenga tos o arcadas cuando intenta tragar.  La disfagia tiene muchas causas posibles.  El tratamiento de la disfagia depende de la causa de la afeccin.  Concurra a todas las visitas de seguimiento como se lo haya indicado el mdico. Esto es importante. Esta informacin no tiene  Marine scientist el consejo del mdico. Asegrese de hacerle al mdico cualquier pregunta que tenga. Document Revised: 04/03/2019 Document Reviewed: 04/03/2019 Elsevier Patient Education  [place].

## 2020-04-22 ENCOUNTER — Other Ambulatory Visit: Payer: Self-pay

## 2020-04-22 ENCOUNTER — Ambulatory Visit: Payer: Self-pay | Attending: Internal Medicine

## 2020-04-22 ENCOUNTER — Ambulatory Visit: Payer: Self-pay

## 2020-04-30 ENCOUNTER — Ambulatory Visit: Payer: Self-pay | Admitting: *Deleted

## 2020-04-30 ENCOUNTER — Ambulatory Visit
Admission: RE | Admit: 2020-04-30 | Discharge: 2020-04-30 | Disposition: A | Discharge status: Home / Self Care | Payer: No Typology Code available for payment source | Source: Ambulatory Visit | Attending: Obstetrics and Gynecology | Admitting: Obstetrics and Gynecology

## 2020-04-30 ENCOUNTER — Other Ambulatory Visit: Payer: Self-pay

## 2020-04-30 VITALS — BP 104/72 | Temp 98.2°F | Wt 123.1 lb

## 2020-04-30 DIAGNOSIS — N644 Mastodynia: Secondary | ICD-10-CM

## 2020-04-30 DIAGNOSIS — Z1231 Encounter for screening mammogram for malignant neoplasm of breast: Secondary | ICD-10-CM

## 2020-04-30 DIAGNOSIS — N6311 Unspecified lump in the right breast, upper outer quadrant: Secondary | ICD-10-CM

## 2020-04-30 DIAGNOSIS — Z1239 Encounter for other screening for malignant neoplasm of breast: Secondary | ICD-10-CM

## 2020-04-30 NOTE — Progress Notes (Signed)
Ms. Hermelinda Diegel is a 44 y.o. female who presents to [facility] clinic today with complaints of a right breast lump and tenderness.    Pap Smear: Pap smear not completed today. Last Pap smear was 03/20/2020 at [facility]) clinic and was normal with negative HPV. Per patient has no history of an abnormal Pap smear. Last Pap smear result is not available in Epic but records have been obtained from [facility].   Physical exam: Breasts Breasts symmetrical. No skin abnormalities bilateral breasts. No nipple retraction bilateral breasts. No nipple discharge bilateral breasts. No lymphadenopathy. A small BB sized lump was palpated in the right breast at 11 o'clock 4 cm from the nipple. Patient states that she first became aware of this lump during a breast exam when she got her Pap smear. She also complains of right outer breast tenderness that she rates as a 5/10 in severity.        Pelvic/Bimanual Pap is not indicated today.    Smoking History: Patient has never smoked.   Patient Navigation: Patient education provided. Access to services provided for patient through BCCCP program. Natale Lay, Spanish interpreter provided through [facility].   Breast and Cervical Cancer Risk Assessment: Patient does not have family history of breast cancer, known genetic mutations, or radiation treatment to the chest before age 44. Patient does not have history of cervical dysplasia, immunocompromised, or DES exposure in-utero.  Risk Assessment    Risk Scores      04/30/2020   Last edited by: Narda Rutherford, LPN   5-year risk: 0.3 %   Lifetime risk: 5.1 %          A: BCCCP exam without pap smear Complaints of right breast lump and tenderness.  P: Referred patient to the [facility] Center of [facility] for a diagnostic mammogram. Appointment scheduled April 30, 2020 at 12:40pm.  Mila Homer, RN, FNP student 04/30/2020 11:54 AM    Attestation of Supervision of Student:  I  confirm that I have verified the information documented in the nurse practitioner student's note and that I have also personally reperformed the history, physical exam and all medical decision making activities.  I have verified that all services and findings are accurately documented in this student's note; and I agree with management and plan as outlined in the documentation. I have also made any necessary editorial changes.  [place]ann[place], Kathaleen Maser, RN Center for [employer], [facility] 04/30/2020 3:22 PM

## 2020-04-30 NOTE — Patient Instructions (Addendum)
Informed Joanna Vega about breast self awareness. Patient did not need a Pap smear today due to last Pap smear was on 03/20/2020 per patient. Let her know BCCCP will cover Pap smears and HPV typing every 5 years unless has a history of abnormal Pap smears. Referred patient to the [facility] of [facility] for diagnostic mammogram. Appointment scheduled for April 30, 2020 at 11:15am. Patient aware of appointment and will be there. Joanna Vega verbalized understanding.  Mila Homer, RN, FNP student 12:38 PM

## 2020-12-16 ENCOUNTER — Other Ambulatory Visit: Payer: Self-pay | Admitting: Obstetrics and Gynecology

## 2020-12-16 DIAGNOSIS — Z1231 Encounter for screening mammogram for malignant neoplasm of breast: Secondary | ICD-10-CM

## 2021-01-21 ENCOUNTER — Ambulatory Visit: Payer: No Typology Code available for payment source

## 2021-05-06 ENCOUNTER — Ambulatory Visit
Admission: RE | Admit: 2021-05-06 | Discharge: 2021-05-06 | Disposition: A | Discharge status: Home / Self Care | Payer: Self-pay | Source: Ambulatory Visit | Attending: Obstetrics and Gynecology | Admitting: Obstetrics and Gynecology

## 2021-05-06 ENCOUNTER — Ambulatory Visit: Payer: No Typology Code available for payment source | Admitting: *Deleted

## 2021-05-06 ENCOUNTER — Other Ambulatory Visit: Payer: Self-pay

## 2021-05-06 VITALS — BP 102/70 | Wt 127.4 lb

## 2021-05-06 DIAGNOSIS — Z1231 Encounter for screening mammogram for malignant neoplasm of breast: Secondary | ICD-10-CM

## 2021-05-06 DIAGNOSIS — Z1239 Encounter for other screening for malignant neoplasm of breast: Secondary | ICD-10-CM

## 2021-05-06 NOTE — Patient Instructions (Signed)
Explained breast self awareness with Keturah Barre. Patient did not need a Pap smear today due to last Pap smear and HPV typing was 03/20/2020. Let her know BCCCP will cover Pap smears and HPV typing every 5 years unless has a history of abnormal Pap smears. Referred patient to the [facility] of [facility] for a screening mammogram on the mobile unit. Appointment scheduled Thursday, May 06, 2021 at 1000. Patient escorted to the mobile unit following BCCCP appointment for her screening mammogram. Let patient know the Breast Center will follow up with her within the next couple weeks with results of mammogram by letter or phone. Keturah Barre verbalized understanding.  Derrian Poli, Kathaleen Maser, RN 9:07 AM

## 2021-05-06 NOTE — Progress Notes (Signed)
[name] is a 45 y.o. female who presents to [facility] clinic today with no complaints.    Pap Smear: Pap smear not completed today. Last Pap smear was 03/20/2020 at [facility]) clinic and was normal with negative HPV. Per patient has no history of an abnormal Pap smear. Last Pap smear result is available in Epic.   Physical exam: Breasts Breasts symmetrical. No skin abnormalities bilateral breasts. No nipple retraction bilateral breasts. No nipple discharge bilateral breasts. No lymphadenopathy. No lumps palpated bilateral breasts. No complaints of pain or tenderness on exam.  MS DIGITAL DIAG TOMO BILAT  Result Date: 04/30/2020 CLINICAL DATA:  The patient had a recent right breast lump which is painful. The lump is smaller and the pain has resolved. EXAM: DIGITAL DIAGNOSTIC BILATERAL MAMMOGRAM WITH CAD AND TOMO ULTRASOUND RIGHT BREAST COMPARISON:  None. ACR Breast Density Category c: The breast tissue is heterogeneously dense, which may obscure small masses. FINDINGS: No suspicious masses, calcifications, or distortion are identified in either breast. Mammographic images were processed with CAD. On physical exam, no suspicious lumps are identified. Targeted ultrasound is performed, showing no sonographic abnormalities in the region of the patient's right breast lump. IMPRESSION: No mammographic or sonographic evidence of malignancy. No cause for the patient's right breast lump is identified. RECOMMENDATION: Treatment of the patient's right breast lump should be based on clinical and physical exam given lack of imaging findings. Recommend annual screening mammography. I have discussed the findings and recommendations with the patient. If applicable, a reminder letter will be sent to the patient regarding the next appointment. BI-RADS CATEGORY  1: Negative. Electronically Signed   By: Joanna Vega M.D   On: 04/30/2020 13:35        Pelvic/Bimanual Pap is  not indicated today per BCCCP guidelines.   Smoking History: Patient has never smoked.   Patient Navigation: Patient education provided. Access to services provided for patient through [facility] program. Spanish interpreter Joanna Vega from [place] provided.    Breast and Cervical Cancer Risk Assessment: Patient does not have family history of breast cancer, known genetic mutations, or radiation treatment to the chest before age 62. Patient does not have history of cervical dysplasia, immunocompromised, or DES exposure in-utero.  Risk Assessment     Risk Scores       05/06/2021 04/30/2020   Last edited by: Joanna Vega, CMA Joanna Vega, Joanna Demetrius Charity, Joanna Vega   5-year risk: 0.3 % 0.3 %   Lifetime risk: 5.1 % 5.1 %            A: BCCCP exam without pap smear No complaints.  P: Referred patient to the [facility] Center of [facility] for a screening mammogram on the mobile unit. Appointment scheduled Thursday, May 06, 2021 at 1000.  Joanna Heidelberg, RN 05/06/2021 9:07 AM

## 2021-06-30 ENCOUNTER — Other Ambulatory Visit: Payer: Self-pay

## 2021-06-30 ENCOUNTER — Inpatient Hospital Stay: Payer: Self-pay | Attending: Obstetrics and Gynecology | Admitting: *Deleted

## 2021-06-30 VITALS — BP 98/72 | Ht 59.0 in | Wt 126.6 lb

## 2021-06-30 DIAGNOSIS — Z0000 Encounter for general adult medical examination without abnormal findings: Secondary | ICD-10-CM

## 2021-06-30 NOTE — Progress Notes (Signed)
[facility] initial screening   Interpreter- Natale Lay, [employer]   Clinical Measurement: There were no vitals filed for this visit. Fasting Labs Drawn Today, will review with patient when they result.   Medical History:  Patient states that she does not have high cholesterol, does not have high blood pressure and she does not have diabetes.  Medications:  Patient states that she does not take medication to lower cholesterol, blood pressure or blood sugar.  Patient does not take an aspirin a day to help prevent a heart attack or stroke.    Blood pressure, self measurement:  :  Patient states that she does not measure blood pressure from home. She checks her blood pressure N/A. She shares her readings with a health care provider: N/A.   Nutrition: Patient states that on average she eats 0 cups of fruit and 0 cups of vegetables per day. Patient states that she does not eat fish at least 2 times per week. Patient eats less than half servings of whole grains. Patient drinks less than 36 ounces of beverages with added sugar weekly: yes. Patient is currently watching sodium or salt intake: yes. In the past 7 days patient has consumed drinks containing alcohol on 0 days. On a day that patient consumes drinks containing alcohol on average 0 drinks are consumed.      Physical activity:  Patient states that she gets 0 minutes of moderate and 0 minutes of vigorous physical activity each week.  Smoking status:  Patient states that she has has never smoked .   Quality of life:  Over the past 2 weeks patient states that she had little interest or pleasure in doing things: not at all. She has been feeling down, depressed or hopeless:not at all.    Risk reduction and counseling:   Health Coaching: Spoke with patient about the daily recommendation for fruits and vegetables. (2 cups of fruit and 3 cups of vegetables) Showed patient what a serving size would look like. Patient consumes tilapia (filet) once a  week. Gave suggestions for heart healthy fish that she can add into diet (salmon, tuna, mackerel, sardines, sea bass or trout). Patient consumes oatmeal occasionally and whole grain cereal. Gave suggestions for whole wheat bread, brown rice, whole wheat pasta and whole grain cereals (look for the heart symbol on the cereal box). Patient does not consume beverages with added sugars often. Patient is not currently exercising. Encouraged patient to try and start exercising for 20 minutes a day. Gave patient educational brochures on diet and exercise.     Navigation:  I will notify patient of lab results.  Patient is aware of 2 more health coaching sessions and a follow up.  Time: 25 minutes

## 2021-07-01 LAB — LIPID PANEL
Chol/HDL Ratio: 3.6 ratio (ref 0.0–4.4)
Cholesterol, Total: 177 mg/dL (ref 100–199)
HDL: 49 mg/dL (ref >39)
LDL Chol Calc (NIH): 108 mg/dL — ABNORMAL HIGH (ref 0–99)
Triglycerides: 111 mg/dL (ref 0–149)
VLDL Cholesterol Cal: 20 mg/dL (ref 5–40)

## 2021-07-01 LAB — GLUCOSE, RANDOM: Glucose: 115 mg/dL — ABNORMAL HIGH (ref 65–99)

## 2021-07-01 LAB — HEMOGLOBIN A1C
Est. average glucose Bld gHb Est-mCnc: 131 mg/dL
Hgb A1c MFr Bld: 6.2 % — ABNORMAL HIGH (ref 4.8–5.6)

## 2021-07-05 ENCOUNTER — Telehealth: Payer: Self-pay

## 2021-07-05 NOTE — Telephone Encounter (Signed)
Health coaching 2   interpreter- Natale Lay, [facility]   Labs- 177 cholesterol, 108 LDL cholesterol, 111 triglycerides, 49 HDL cholesterol, 6.2 hemoglobin A1C, 115 mean plasma glucose. Patient understands and is aware of her lab results.   Goals-  1. Reduce the amount of fried and fatty foods consumed. Try to grill, bake, broil or sautee foods instead.  2. Reduce the amount of red meat consumed. Substitute for lean protein like chicken, heart healthy fish and [place]. 3. Reduce the amount of whole fat dairy products consumed. Look for low-fat or reduced-fat options instead.  4. Increase the amount of whole grains consumed. 5. Decrease the amount of sweets and sugars consumed in both food and drink. Decrease the amount of carbs consumed.  6. Start exercising for at least 20 minutes.  Spoke with patient about practicing a diabetes diet. Explained to patient what this means.    Navigation:  Patient is aware of 1 more health coaching sessions and a follow up. Patient is scheduled for follow-up with [facility] on Wednesday, October 12th @ 3:50 pm for elevated labs.  Time-  15 minutes

## 2021-07-05 NOTE — Telephone Encounter (Signed)
Attempted to call patient to give lab results for [facility]. Patient was at work and unable to speak on the phone. Will try back at a later time.

## 2021-07-21 ENCOUNTER — Other Ambulatory Visit: Payer: Self-pay

## 2021-07-21 ENCOUNTER — Ambulatory Visit: Payer: Self-pay | Attending: Physician Assistant | Admitting: Physician Assistant

## 2021-07-21 ENCOUNTER — Encounter: Payer: Self-pay | Admitting: Physician Assistant

## 2021-07-21 DIAGNOSIS — E7800 Pure hypercholesterolemia, unspecified: Secondary | ICD-10-CM

## 2021-07-21 DIAGNOSIS — R7303 Prediabetes: Secondary | ICD-10-CM

## 2021-07-21 DIAGNOSIS — Z789 Other specified health status: Secondary | ICD-10-CM

## 2021-07-21 NOTE — Progress Notes (Signed)
Virtual Visit via Telephone Note  I connected with Joanna Vega on 07/21/21 at  3:50 PM EDT by telephone and verified that I am speaking with the correct person using two identifiers.  Location: Patient: home Provider: [facility] office [place] ID# [id number]   I discussed the limitations, risks, security and privacy concerns of performing an evaluation and management service by telephone and the availability of in person appointments. I also discussed with the patient that there may be a patient responsible charge related to this service. The patient expressed understanding and agreed to proceed.   History of Present Illness:  I am following up with patient bc her A1C=6.2 06/30/2021.  Her LDL was 108.  She does not follow cholesterol or diabetic diet.  Denies any concerns or issues.      Observations/Objective: NAD.  A&Ox3.   Assessment and Plan:  1. Prediabetes I have had a lengthy discussion and provided education about insulin resistance and the intake of too much sugar/refined carbohydrates.  I have advised the patient to work at a goal of eliminating sugary drinks, candy, desserts, sweets, refined sugars, processed foods, and white carbohydrates.  The patient expresses understanding.    2. Language barrier pacific interpreters used and additional time performing visit was required.   3. Elevated LDL cholesterol level Heart healthy diet and exercise     Follow Up Instructions: Recheck with PCP/ A1C in 3-4 months   I discussed the assessment and treatment plan with the patient. The patient was provided an opportunity to ask questions and all were answered. The patient agreed with the plan and demonstrated an understanding of the instructions.   The patient was advised to call back or seek an in-person evaluation if the symptoms worsen or if the condition fails to improve as anticipated.  I provided 16 minutes of non-face-to-face time during this encounter.   Georgian Co, PA-C  Patient ID: Joanna Vega, female   DOB: 02/08/1976, 45 y.o.   MRN: [id number]

## 2021-09-17 ENCOUNTER — Telehealth: Payer: Self-pay | Admitting: Internal Medicine

## 2021-09-17 NOTE — Telephone Encounter (Signed)
I return Pt call, schedule a financial appt for 09/22/21 

## 2021-09-17 NOTE — Telephone Encounter (Signed)
Copied from CRM [id number]. Topic: General - Other >> Sep 16, 2021  1:26 PM Jaquita Rector A wrote: Reason for CRM: Patient called in needing to speak to [facility] about getting an appointment for the orange card.  Ph# [phone]

## 2021-09-22 ENCOUNTER — Other Ambulatory Visit: Payer: Self-pay

## 2021-09-22 ENCOUNTER — Ambulatory Visit: Payer: Self-pay | Attending: Internal Medicine

## 2021-11-29 ENCOUNTER — Other Ambulatory Visit: Payer: Self-pay

## 2021-11-29 ENCOUNTER — Encounter: Payer: Self-pay | Admitting: Internal Medicine

## 2021-11-29 ENCOUNTER — Ambulatory Visit: Payer: Self-pay | Attending: Internal Medicine | Admitting: Internal Medicine

## 2021-11-29 VITALS — BP 107/72 | HR 71 | Resp 16 | Ht 60.0 in | Wt 123.4 lb

## 2021-11-29 DIAGNOSIS — R7303 Prediabetes: Secondary | ICD-10-CM

## 2021-11-29 DIAGNOSIS — E785 Hyperlipidemia, unspecified: Secondary | ICD-10-CM

## 2021-11-29 DIAGNOSIS — Z23 Encounter for immunization: Secondary | ICD-10-CM

## 2021-11-29 LAB — POCT GLYCOSYLATED HEMOGLOBIN (HGB A1C): HbA1c, POC (controlled diabetic range): 6 % (ref 0.0–7.0)

## 2021-11-29 NOTE — Patient Instructions (Signed)
Plan de alimentacin para personas con prediabetes Prediabetes Eating Plan La prediabetes es una afeccin que hace que los niveles de azcar en la sangre (glucosa) sean ms altos de lo normal. Esto aumenta el riesgo de desarrollar diabetes tipo 2 (diabetes mellitus tipo 2). Trabajar con un profesional de la salud o especialista en nutricin (nutricionista) para hacer cambios en la dieta y el estilo de vida puede ayudar a prevenir el inicio de la diabetes. Estos cambios pueden ayudarlo a: Controlar los niveles de glucemia. Mejorar los niveles de colesterol. Controlar la presin arterial. Consejos para seguir este plan Al leer las etiquetas de los alimentos Lea las etiquetas de los alimentos envasados para controlar la cantidad de grasa, sal (sodio) y azcar que contienen. Evite los alimentos que contengan lo siguiente: Grasas saturadas. Grasas trans. Azcares agregados. Evite los alimentos que contengan ms de 300 miligramos (mg) de sodio por porcin. Limite el consumo de sodio a menos de 2300 mg por da. Al ir de compras Evite comprar alimentos procesados y preelaborados. Evite comprar bebidas con azcar agregada. Al cocinar Cocine con aceite de oliva. No use mantequilla, manteca de cerdo ni mantequilla clarificada. Cocine los alimentos al horno, a la parrilla, asados, al vapor o hervidos. Evite frerlos. Planificacin de las comidas  Trabaje con el nutricionista para crear un plan de alimentacin que sea adecuado para usted. Esto puede incluir el seguimiento de cuntas caloras ingiere al da. Use un registro de alimentos, un cuaderno o una aplicacin mvil para anotar lo que comi en cada comida. Considere la posibilidad de seguir una dieta mediterrnea. Esta puede comprender lo siguiente: Comer varias porciones de frutas y verduras frescas por da. Pescado al menos dos veces por semana. Comer una porcin de cereales integrales, frijoles, frutos secos y semillas por da. Aceite de oliva  en lugar de otras grasas. Limitar el consumo de alcohol. Limitar la carne roja. Usar productos lcteos descremados o con bajo contenido de grasa. Considerar seguir una dieta a base de vegetales. Esta incluye hacer elecciones alimentarias que se concentren en comer principalmente verduras y frutas, cereales, frijoles, frutos secos y semillas. Si tiene hipertensin arterial, quizs deba limitar el consumo de sodio o seguir una dieta como el plan de alimentacin basado en los Enfoques Alimentarios para Detener la Hipertensin (Dietary Approaches to Stop Hypertension, DASH). La dieta DASH tiene como objetivo bajar la hipertensin arterial.  Estilo de vida Establezca metas para bajar de peso con la ayuda de su equipo de atencin mdica. A la mayora de las personas con prediabetes se les recomienda bajar un 7 % de su peso corporal. Haga al menos 30 minutos de ejercicio, 5 o ms das a la semana. Asista a un grupo de apoyo o solicite el apoyo de un consejero de salud mental. Use los medicamentos de venta libre y los recetados solamente como se lo haya indicado el mdico. Qu alimentos se recomiendan? Frutas Bayas. Bananas. Manzanas. Naranjas. Uvas. Papaya. Mango. Granada. Kiwi. Pomelo.Cerezas. Verduras Lechuga. Espinaca. Guisantes. Remolachas. Coliflor. Repollo. Brcoli. Zanahorias. Tomates. Calabaza. Berenjena. Hierbas. Pimientos. Cebollas.Pepinos. Coles de Bruselas. Granos Productos integrales, como panes, galletas, cereales y pastas de salvado o integrales. Avena sin azcar. Trigo burgol. Cebada. Quinua. Arroz integral.Tacos o tortillas de harina de maz o de salvado. Carnes y otras protenas Mariscos. Carne de ave sin piel. Cortes magros de cerdo y carne de res. Tofu.Huevos. Frutos secos. Frijoles. Lcteos Productos lcteos descremados o semidescremados, como yogur, queso cottage yqueso. Bebidas Agua. T. Caf. Gaseosas sin azcar o dietticas. Soda. Leche   descremada o con bajo contenido de  grasa. Productos alternativos a la leche, como leche de sojao de almendras. Grasas y aceites Aceite de oliva. Aceite de canola. Aceite de girasol. Aceite de semillas deuva. Aguacate. Nueces. Dulces y postres Pudin sin azcar o con bajo contenido de grasa. Helado y otros postrescongelados sin azcar o con bajo contenido de grasa. Alios y condimentos Hierbas. Especias sin sodio. Mostaza. Salsa de pepinillos. Ktchup con bajo contenido de sal y de azcar. Salsa barbacoa con bajo contenido de sal y deazcar. Mayonesa con bajo contenido de grasa o sin grasa. Es posible que los productos mencionados arriba no formen una lista completa de las bebidas o los alimentos recomendados. Consulte a un nutricionista para obtener ms informacin. Qu alimentos no se recomiendan? Frutas Frutas enlatadas al almbar. Verduras Verduras enlatadas. Verduras congeladas con mantequilla o salsa de crema. Granos Productos elaborados con harina y harina blanca refinada, como panes, pastas,bocadillos y cereales. Carnes y otras protenas Cortes de carne con alto contenido de grasa. Carne de ave con piel. Carneempanizada o frita. Carnes procesadas. Lcteos Yogur, queso o leche enteros. Bebidas Bebidas azucaradas, como t helado y gaseosas. Grasas y aceites Mantequilla. Manteca de cerdo. Mantequilla clarificada. Dulces y postres Productos horneados, como pasteles, pastelitos, galletas dulces y tarta dequeso. Alios y condimentos Mezclas de especias con sal agregada. Ktchup. Salsa barbacoa. Mayonesa. Es posible que los productos que se enumeran ms arriba no sean una lista completa de los alimentos y las bebidas que no se recomiendan. Consulte a un nutricionista para obtener ms informacin. Dnde buscar ms informacin American Diabetes Association (Asociacin Estadounidense de la Diabetes): www.diabetes.org Resumen Es posible que deba hacer cambios en la dieta y el estilo de vida para ayudar a prevenir el inicio  de la diabetes. Estos cambios pueden ayudarlo a controlar el azcar en la sangre, mejorar los niveles de colesterol y controlar la presin arterial. Establezca metas para bajar de peso con la ayuda de su equipo de atencin mdica. A la mayora de las personas con prediabetes se les recomienda bajar un 7 % de su peso corporal. Considere la posibilidad de seguir una dieta mediterrnea. Esto incluye comer muchas frutas y verduras frescas, cereales integrales, frijoles, frutos secos, semillas, pescado y productos lcteos con bajo contenido de grasa, y usar aceite de oliva en lugar de otras grasas. Esta informacin no tiene como fin reemplazar el consejo del mdico. Asegresede hacerle al mdico cualquier pregunta que tenga. Document Revised: 03/24/2020 Document Reviewed: 03/24/2020 Elsevier Patient Education  2022 Elsevier Inc.  

## 2021-11-29 NOTE — Progress Notes (Signed)
Patient ID: Joanna Vega, female    DOB: [birth year]  MRN: [id number]  CC: Prediabetes   Subjective: Joanna Vega is a 46 y.o. female who presents for chronic ds management.  Her 75 yr old daughter and young son is with her Her concerns today include:  Pt with hx of preDM, HL  Pt had [facility] eval 06/2021.  Found to have preDM with A1C of 6.2 and hyperlipidemia with LDL of 108. Repeat A1C today is 6.0 She reports she does not exercise On her visit at the [facility], she was told that her heart rate was a bit fast.  Patient denies any chest pains or palpitations.  HM: due for flu shot.   Patient Active Problem List   Diagnosis Date Noted   Prediabetes 07/21/2021   OVARIAN CYST 12/07/2006     No current outpatient medications on file prior to visit.   No current facility-administered medications on file prior to visit.    No Known Allergies  Social History   Socioeconomic History   Marital status: Significant Other    Spouse name: Joanna Vega   Number of children: 5   Years of education: 4   Highest education level: 3rd grade  Occupational History   Occupation: Housewife  Tobacco Use   Smoking status: Never   Smokeless tobacco: Never  Vaping Use   Vaping Use: Never used  Substance and Sexual Activity   Alcohol use: No    Alcohol/week: 0.0 standard drinks   Drug use: No   Sexual activity: Yes    Birth control/protection: I.U.D.  Other Topics Concern   Not on file  Social History Narrative   Originally from [place]   Came to [employer] [place]. In 2007      Lives at home with husband and 3 of 5 children       2 adult children live in [place]   Social Determinants of Health   Financial Resource Strain: Not on file  Food Insecurity: No Food Insecurity   Worried About Programme researcher, broadcasting/film/video in the Last Year: Never true   Barista in the Last Year: Never true  Transportation Needs: No Transportation Needs   Lack of Transportation (Medical): No   Lack of  Transportation (Non-Medical): No  Physical Activity: Not on file  Stress: Not on file  Social Connections: Not on file  Intimate Partner Violence: Not on file    Family History  Problem Relation Age of Onset   Asthma Mother    Alcohol abuse Father    Breast cancer Neg Hx     Past Surgical History:  Procedure Laterality Date   NO PAST SURGERIES      ROS: Review of Systems Negative except as stated above  PHYSICAL EXAM: BP 107/72    Pulse 71    Resp 16    Ht 5' (1.524 m)    Wt 123 lb 6.4 oz (56 kg)    SpO2 98%    BMI 24.10 kg/m   Physical Exam  General appearance - alert, well appearing, middle-aged Hispanic female and in no distress Mental status - normal mood, behavior, speech, dress, motor activity, and thought processes Neck - supple, no significant adenopathy Chest - clear to auscultation, no wheezes, rales or rhonchi, symmetric air entry Heart - normal rate, regular rhythm, normal S1, S2, no murmurs, rubs, clicks or gallops Extremities - peripheral pulses normal, no pedal edema, no clubbing or cyanosis   CMP Latest Ref Rng & Units 06/30/2021  01/27/2014 01/01/2010  Glucose 65 - 99 mg/dL 035(W) 656(C) 127(N)  BUN 6 - 23 mg/dL - 18 8  Creatinine 1.70 - 1.10 mg/dL - 0.17 4.94  Sodium 496 - 147 mEq/L - 137 139  Potassium 3.7 - 5.3 mEq/L - 4.1 3.7  Chloride 96 - 112 mEq/L - 100 106  CO2 19 - 32 mEq/L - 24 28  Calcium 8.4 - 10.5 mg/dL - 9.0 9.1  Total Protein 6.0 - 8.3 g/dL - 7.6 -  Total Bilirubin 0.3 - 1.2 mg/dL - <7.5(F) -  Alkaline Phos 39 - 117 U/L - 68 -  AST 0 - 37 U/L - 18 -  ALT 0 - 35 U/L - 19 -   Lipid Panel     Component Value Date/Time   CHOL 177 06/30/2021 0859   TRIG 111 06/30/2021 0859   HDL 49 06/30/2021 0859   CHOLHDL 3.6 06/30/2021 0859   LDLCALC 108 (H) 06/30/2021 0859    CBC    Component Value Date/Time   WBC 16.6 (H) 01/06/2015 1400   RBC 3.36 (L) 01/06/2015 1400   HGB 10.3 (L) 01/06/2015 1400   HCT 30.3 (L) 01/06/2015 1400   PLT 178  01/06/2015 1400   MCV 90.2 01/06/2015 1400   MCH 30.7 01/06/2015 1400   MCHC 34.0 01/06/2015 1400   RDW 14.8 01/06/2015 1400   LYMPHSABS 2.2 01/27/2014 1723   MONOABS 0.6 01/27/2014 1723   EOSABS 0.4 01/27/2014 1723   BASOSABS 0.1 01/27/2014 1723    ASSESSMENT AND PLAN: 1. Prediabetes Discussed diagnosis of prediabetes.  Went over the importance of getting in some form of moderate intensity exercise for at least 30 minutes 5 days a week. Patient advised to eliminate sugary drinks from the diet, cut back on portion sizes especially of white carbohydrates, eat more white lean meat like chicken [place] and seafood instead of beef or pork and incorporate fresh fruits and vegetables into the diet daily.  - POCT glycosylated hemoglobin (Hb A1C) - CBC; Future - Comprehensive metabolic panel; Future - Comprehensive metabolic panel - CBC  2. Hyperlipidemia LDL goal <100 See #1 above.  3. Need for immunization against influenza - Flu Vaccine QUAD 78mo+IM (Fluarix, Fluzone & Alfiuria Quad PF)    [employer] Language interpreter used during this encounter. #[id number], Joanna Vega  Patient was given the opportunity to ask questions.  Patient verbalized understanding of the plan and was able to repeat key elements of the plan.   Orders Placed This Encounter  Procedures   Flu Vaccine QUAD 39mo+IM (Fluarix, Fluzone & Alfiuria Quad PF)   POCT glycosylated hemoglobin (Hb A1C)     Requested Prescriptions    No prescriptions requested or ordered in this encounter    No follow-ups on file.  Joanna Blue, MD, FACP

## 2021-11-30 LAB — COMPREHENSIVE METABOLIC PANEL
ALT: 17 IU/L (ref 0–32)
AST: 14 IU/L (ref 0–40)
Albumin/Globulin Ratio: 1.6 (ref 1.2–2.2)
Albumin: 4.4 g/dL (ref 3.8–4.8)
Alkaline Phosphatase: 79 IU/L (ref 44–121)
BUN/Creatinine Ratio: 22 (ref 9–23)
BUN: 15 mg/dL (ref 6–24)
Bilirubin Total: 0.2 mg/dL (ref 0.0–1.2)
CO2: 24 mmol/L (ref 20–29)
Calcium: 8.8 mg/dL (ref 8.7–10.2)
Chloride: 103 mmol/L (ref 96–106)
Creatinine, Ser: 0.67 mg/dL (ref 0.57–1.00)
Globulin, Total: 2.8 g/dL (ref 1.5–4.5)
Glucose: 95 mg/dL (ref 70–99)
Potassium: 3.9 mmol/L (ref 3.5–5.2)
Sodium: 139 mmol/L (ref 134–144)
Total Protein: 7.2 g/dL (ref 6.0–8.5)
eGFR: 111 mL/min/{1.73_m2} (ref >59)

## 2021-11-30 LAB — CBC
Hematocrit: 36.7 % (ref 34.0–46.6)
Hemoglobin: 12.4 g/dL (ref 11.1–15.9)
MCH: 29.7 pg (ref 26.6–33.0)
MCHC: 33.8 g/dL (ref 31.5–35.7)
MCV: 88 fL (ref 79–97)
Platelets: 281 10*3/uL (ref 150–450)
RBC: 4.18 x10E6/uL (ref 3.77–5.28)
RDW: 13.1 % (ref 11.7–15.4)
WBC: 6.6 10*3/uL (ref 3.4–10.8)

## 2021-11-30 NOTE — Progress Notes (Signed)
Kidney and liver function tests are normal.  Blood cell counts are normal.

## 2021-12-02 ENCOUNTER — Telehealth: Payer: Self-pay

## 2021-12-02 NOTE — Telephone Encounter (Signed)
Contacted pt to go over lab results pt didn't answer lvm   [employer] interpreter: Roxanin ID: [id number]  Sent a CRM and forward labs to NT to give pt labs when they call back

## 2021-12-17 ENCOUNTER — Telehealth: Payer: Self-pay | Admitting: Internal Medicine

## 2021-12-17 NOTE — Telephone Encounter (Signed)
Copied from CRM [id number]. Topic: General - Other >> Dec 17, 2021  3:43 PM Jaquita Rector A wrote: Reason for CRM: Patient called in to inquire of Dr Laural Benes if she can prescribe her some medication for high cholesterol. Patient can be reached at  Ph# [phone]

## 2021-12-21 NOTE — Telephone Encounter (Signed)
Will forward to provider  

## 2021-12-23 NOTE — Telephone Encounter (Signed)
[employer] Interperter: Daniella ?ID [id number] ?Contacted pt and made aware  ?

## 2022-01-19 ENCOUNTER — Telehealth: Payer: Self-pay

## 2022-01-19 NOTE — Telephone Encounter (Signed)
Health Coaching 3 ? ?interpreter- Natale Lay, [employer] ?  ?Goals- Patient has been trying to eat healthier. Patient has reduced the amount of red meat that she consumes. Patient has also reduced the amount of sodas that she consumes. Patient has added salmon and tilapia into diet. Patient has also added green vegetables into regular diet. Patient has cut back on the amount of rice that she consumes. Patient has also cut back on the amount of tortillas that she consumes. Patient has cut back on the amount of tortillas that she comes she has gone from 6 daily to 3.  ?  ?New goal- Patient has been walking 3 days a week for 20-30 minutes. New goal will be 5 days a week for 20-30 minutes.  ? ?Barrier to reaching goal- NA ?  ?Strategies to overcome- NA ?  ?Navigation:  Patient is aware of  a follow up session. Patient is scheduled for follow-up on Wednesday, April 06, 2022 @ 3:30 pm. ?  ?Time-  10 minutes ?

## 2022-04-06 ENCOUNTER — Ambulatory Visit: Payer: No Typology Code available for payment source

## 2022-05-25 ENCOUNTER — Other Ambulatory Visit: Payer: Self-pay

## 2022-05-25 ENCOUNTER — Inpatient Hospital Stay: Payer: No Typology Code available for payment source | Attending: Obstetrics and Gynecology | Admitting: *Deleted

## 2022-05-25 VITALS — BP 90/62 | Ht 59.0 in | Wt 120.8 lb

## 2022-05-25 DIAGNOSIS — Z1231 Encounter for screening mammogram for malignant neoplasm of breast: Secondary | ICD-10-CM

## 2022-05-25 DIAGNOSIS — Z01419 Encounter for gynecological examination (general) (routine) without abnormal findings: Secondary | ICD-10-CM

## 2022-05-25 NOTE — Progress Notes (Signed)
[facility] follow up   Interpreter: Thomasene Mohair, Haroldine Laws  Clinical Measurement:   Vitals:   05/25/22 1514  BP: 98/66      Medical History:  Patient states that she has high cholesterol, does not have high blood pressure and she does not have diabetes.  Medications:  Patient states that she does not take medication to lower cholesterol, blood pressure and blood sugar.  Patient does not take an aspirin a day to help prevent a heart attack or stroke.    Blood pressure, self measurement: Patient states that she does not measure blood pressure from home. She checks her blood pressure N/A. She shares her readings with a health care provider: N/A.   Nutrition: Patient states that on average she eats 1 cups of fruit and 0 cups of vegetables per day. Patient states that she does not eat fish at least 2 times per week. Patient eats less than half servings of whole grains. Patient drinks less than 36 ounces of beverages with added sugar weekly: yes. Patient is currently watching sodium or salt intake: yes. In the past 7 days patient has had 0 drinks containing alcohol. On average patient drinks 0 drinks containing alcohol per day.      Physical activity:  Patient states that she gets 0 minutes of moderate and 0 minutes of vigorous physical activity each week.  Smoking status:  Patient states that she has has never smoked .   Quality of life:  Over the past 2 weeks patient states that she had little interest or pleasure in doing things: not at all. She has been feeling down, depressed or hopeless:not at all.    Risk reduction and counseling:   Health Coaching: Spoke with patient about the daily recommendation for fruits and vegetables (2 cups fruit and 3 cups vegetables). Patient consumes salmon once a week. Patient consumes oatmeal but no other whole grains. Gave suggestions for other whole grains that she can try and add into diet (whole wheat bread, brown rice, whole wheat pasta or whole grain  cereals). Patient does not consume sodas often and watching sodium intake. Patient has not been exercising recently. Encouraged patient to try and start exercising for 20-30 minutes daily.    Navigation: This was the  follow up session for this patient, I will check up on her progress in the coming months.  Time: 20 minutes

## 2022-05-30 ENCOUNTER — Ambulatory Visit: Payer: Self-pay | Attending: Internal Medicine | Admitting: Internal Medicine

## 2022-05-30 ENCOUNTER — Encounter: Payer: Self-pay | Admitting: Internal Medicine

## 2022-05-30 VITALS — BP 95/60 | HR 67 | Temp 98.0°F | Wt 120.0 lb

## 2022-05-30 DIAGNOSIS — R7303 Prediabetes: Secondary | ICD-10-CM

## 2022-05-30 NOTE — Progress Notes (Signed)
Patient ID: Joanna Vega, female    DOB: 1976-07-16  MRN: [id number]  CC: Prediabetes   Subjective: Joanna Vega is a 46 y.o. female who presents for 6 mth f/u Her concerns today include:  Pt with hx of preDM, HL  Dx with reDM on last visit.  Counseling was given. Since then pt has been walking 2-3 times per wk for 30-60 mins. Also feels she is doing well with eating habits.  She drinks mainly water. Avoiding too much beef/pork. Eating less bread and rice.  Wgh is stable Declines any blood test today.  HM:  Due for flu and Tdapt vaccine.  Wants flu shot today and prefers to come back to get Tdapt Patient Active Problem List   Diagnosis Date Noted   Hyperlipidemia LDL goal <100 11/29/2021   Prediabetes 07/21/2021   OVARIAN CYST 12/07/2006     No current outpatient medications on file prior to visit.   No current facility-administered medications on file prior to visit.    No Known Allergies  Social History   Socioeconomic History   Marital status: Significant Other    Spouse name: Joanna Vega   Number of children: 5   Years of education: 4   Highest education level: 3rd grade  Occupational History   Occupation: Housewife  Tobacco Use   Smoking status: Never   Smokeless tobacco: Never  Vaping Use   Vaping Use: Never used  Substance and Sexual Activity   Alcohol use: No    Alcohol/week: 0.0 standard drinks of alcohol   Drug use: No   Sexual activity: Yes    Birth control/protection: I.U.D.  Other Topics Concern   Not on file  Social History Narrative   Originally from [place]   Came to [employer]. In 2007      Lives at home with husband and 3 of 5 children       2 adult children live in [place]   Social Determinants of Health   Financial Resource Strain: Not on file  Food Insecurity: No Food Insecurity (05/25/2022)   Hunger Vital Sign    Worried About Running Out of Food in the Last Year: Never true    Ran Out of Food in the Last Year:  Never true  Transportation Needs: No Transportation Needs (05/25/2022)   PRAPARE - Administrator, Civil Service (Medical): No    Lack of Transportation (Non-Medical): No  Physical Activity: Not on file  Stress: Not on file  Social Connections: Not on file  Intimate Partner Violence: Not on file    Family History  Problem Relation Age of Onset   Asthma Mother    Alcohol abuse Father    Breast cancer Neg Hx     Past Surgical History:  Procedure Laterality Date   NO PAST SURGERIES      ROS: Review of Systems Negative except as stated above  PHYSICAL EXAM: BP 95/60   Pulse 67   Temp 98 F (36.7 C) (Oral)   Wt 120 lb (54.4 kg)   SpO2 99%   BMI 24.24 kg/m   Wt Readings from Last 3 Encounters:  05/30/22 120 lb (54.4 kg)  05/25/22 120 lb 12.8 oz (54.8 kg)  11/29/21 123 lb 6.4 oz (56 kg)    Physical Exam  General appearance - alert, well appearing, and in no distress Mental status - normal mood, behavior, speech, dress, motor activity, and thought processes Chest - clear to auscultation, no wheezes, rales or rhonchi, symmetric  air entry Heart - normal rate, regular rhythm, normal S1, S2, no murmurs, rubs, clicks or gallops Extremities - peripheral pulses normal, no pedal edema, no clubbing or cyanosis      Latest Ref Rng & Units 11/29/2021    4:30 PM 06/30/2021    8:59 AM 01/27/2014    5:23 PM  CMP  Glucose 70 - 99 mg/dL 95  240  973   BUN 6 - 24 mg/dL 15   18   Creatinine 5.32 - 1.00 mg/dL 9.92   4.26   Sodium 834 - 144 mmol/L 139   137   Potassium 3.5 - 5.2 mmol/L 3.9   4.1   Chloride 96 - 106 mmol/L 103   100   CO2 20 - 29 mmol/L 24   24   Calcium 8.7 - 10.2 mg/dL 8.8   9.0   Total Protein 6.0 - 8.5 g/dL 7.2   7.6   Total Bilirubin 0.0 - 1.2 mg/dL 0.2   <1.9   Alkaline Phos 44 - 121 IU/L 79   68   AST 0 - 40 IU/L 14   18   ALT 0 - 32 IU/L 17   19    Lipid Panel     Component Value Date/Time   CHOL 177 06/30/2021 0859   TRIG 111 06/30/2021  0859   HDL 49 06/30/2021 0859   CHOLHDL 3.6 06/30/2021 0859   LDLCALC 108 (H) 06/30/2021 0859    CBC    Component Value Date/Time   WBC 6.6 11/29/2021 1630   WBC 16.6 (H) 01/06/2015 1400   RBC 4.18 11/29/2021 1630   RBC 3.36 (L) 01/06/2015 1400   HGB 12.4 11/29/2021 1630   HCT 36.7 11/29/2021 1630   PLT 281 11/29/2021 1630   MCV 88 11/29/2021 1630   MCH 29.7 11/29/2021 1630   MCH 30.7 01/06/2015 1400   MCHC 33.8 11/29/2021 1630   MCHC 34.0 01/06/2015 1400   RDW 13.1 11/29/2021 1630   LYMPHSABS 2.2 01/27/2014 1723   MONOABS 0.6 01/27/2014 1723   EOSABS 0.4 01/27/2014 1723   BASOSABS 0.1 01/27/2014 1723    ASSESSMENT AND PLAN: 1. Prediabetes Patient doing well with eating habits.  Encouraged her to keep up the good work.  Also mention the importance of getting in fresh fruits and vegetables daily into the diet.  Plan was to get flu vaccine today.  However patient does not have [facility] discount and was informed that she would be billed for it.  Patient then declined the vaccine.  We told her that we do have free fluid clinics several times in the fall of which she can attend 1.   [employer] Language interpreter used during this encounter. #[id number] Hesselbach  Patient was given the opportunity to ask questions.  Patient verbalized understanding of the plan and was able to repeat key elements of the plan.   This documentation was completed using Paediatric nurse.  Any transcriptional errors are unintentional.  No orders of the defined types were placed in this encounter.    Requested Prescriptions    No prescriptions requested or ordered in this encounter    Return in about 1 year (around 05/31/2023) for Give appt with [facility] in 2 wks for Tdapt vaccine.  Joanna Blue, MD, FACP

## 2022-07-05 ENCOUNTER — Ambulatory Visit: Payer: No Typology Code available for payment source

## 2022-07-05 ENCOUNTER — Inpatient Hospital Stay: Admission: RE | Admit: 2022-07-05 | Payer: Self-pay | Source: Ambulatory Visit

## 2022-07-05 ENCOUNTER — Ambulatory Visit: Payer: Self-pay | Admitting: Pharmacist

## 2022-08-18 ENCOUNTER — Ambulatory Visit: Payer: Self-pay

## 2022-09-29 ENCOUNTER — Ambulatory Visit: Payer: Self-pay | Admitting: Hematology and Oncology

## 2022-09-29 ENCOUNTER — Ambulatory Visit
Admission: RE | Admit: 2022-09-29 | Discharge: 2022-09-29 | Disposition: A | Discharge status: Home / Self Care | Payer: No Typology Code available for payment source | Source: Ambulatory Visit | Attending: Obstetrics and Gynecology | Admitting: Obstetrics and Gynecology

## 2022-09-29 VITALS — BP 92/51 | Wt 118.0 lb

## 2022-09-29 DIAGNOSIS — Z1231 Encounter for screening mammogram for malignant neoplasm of breast: Secondary | ICD-10-CM

## 2022-09-29 NOTE — Patient Instructions (Signed)
Taught Keturah Barre about self breast awareness and gave educational materials to take home. Patient did not need a Pap smear today due to last Pap smear was in 2022 per patient. Let her know [facility] will cover Pap smears every 5 years unless has a history of abnormal Pap smears. Referred patient to the [facility] for screening mammogram. Appointment scheduled for 09/29/22. Patient aware of appointment and will be there. Let patient know will follow up with her within the next couple weeks with results. Keturah Barre verbalized understanding.  Pascal Lux, NP 9:14 AM

## 2022-09-29 NOTE — Progress Notes (Signed)
Joanna Vega is a 46 y.o. female who presents to [facility] clinic today with no complaints.    Pap Smear: Pap not smear completed today. Last Pap smear was 2022 and was normal. Per patient has no history of an abnormal Pap smear. Last Pap smear result is available in Epic.   Physical exam: Breasts Breasts symmetrical. No skin abnormalities bilateral breasts. No nipple retraction bilateral breasts. No nipple discharge bilateral breasts. No lymphadenopathy. No lumps palpated bilateral breasts.     MS DIGITAL SCREENING TOMO BILATERAL  Result Date: 05/10/2021 CLINICAL DATA:  Screening. EXAM: DIGITAL SCREENING BILATERAL MAMMOGRAM WITH TOMOSYNTHESIS AND CAD TECHNIQUE: Bilateral screening digital craniocaudal and mediolateral oblique mammograms were obtained. Bilateral screening digital breast tomosynthesis was performed. The images were evaluated with computer-aided detection. COMPARISON:  Previous exam(s). ACR Breast Density Category c: The breast tissue is heterogeneously dense, which may obscure small masses. FINDINGS: There are no findings suspicious for malignancy. IMPRESSION: No mammographic evidence of malignancy. A result letter of this screening mammogram will be mailed directly to the patient. RECOMMENDATION: Screening mammogram in one year. (Code:SM-B-01Y) BI-RADS CATEGORY  1: Negative. Electronically Signed   By: Baird Lyons M.D.   On: 05/10/2021 09:18   MS DIGITAL DIAG TOMO BILAT  Result Date: 04/30/2020 CLINICAL DATA:  The patient had a recent right breast lump which is painful. The lump is smaller and the pain has resolved. EXAM: DIGITAL DIAGNOSTIC BILATERAL MAMMOGRAM WITH CAD AND TOMO ULTRASOUND RIGHT BREAST COMPARISON:  None. ACR Breast Density Category c: The breast tissue is heterogeneously dense, which may obscure small masses. FINDINGS: No suspicious masses, calcifications, or distortion are identified in either breast. Mammographic images were processed with CAD. On physical  exam, no suspicious lumps are identified. Targeted ultrasound is performed, showing no sonographic abnormalities in the region of the patient's right breast lump. IMPRESSION: No mammographic or sonographic evidence of malignancy. No cause for the patient's right breast lump is identified. RECOMMENDATION: Treatment of the patient's right breast lump should be based on clinical and physical exam given lack of imaging findings. Recommend annual screening mammography. I have discussed the findings and recommendations with the patient. If applicable, a reminder letter will be sent to the patient regarding the next appointment. BI-RADS CATEGORY  1: Negative. Electronically Signed   By: Gerome Sam III M.D   On: 04/30/2020 13:35      Pelvic/Bimanual Pap is not indicated today    Smoking History: Patient has never smoked and was not referred to quit line.    Patient Navigation: Patient education provided. Access to services provided for patient through B[facility]CP program. Natale Lay interpreter provided. No transportation provided   Colorectal Cancer Screening: Per patient has never had colonoscopy completed No complaints today.    Breast and Cervical Cancer Risk Assessment: Patient does not have family history of breast cancer, known genetic mutations, or radiation treatment to the chest before age 82. Patient does not have history of cervical dysplasia, immunocompromised, or DES exposure in-utero.  Risk Assessment   No risk assessment data for the current encounter  Risk Scores       05/06/2021   Last edited by: Meryl Dare, CMA   5-year risk: 0.3 %   Lifetime risk: 5.1 %            A: BCCCP exam without pap smear No complaints with benign exam.   P: Referred patient to the [facility] for a screening mammogram. Appointment scheduled 09/29/22.  Ilda Basset  A, NP 09/29/2022 8:55 AM

## 2023-06-01 ENCOUNTER — Ambulatory Visit: Payer: Self-pay | Admitting: Internal Medicine

## 2023-06-07 LAB — BASIC METABOLIC PANEL: Glucose: 143

## 2023-06-07 NOTE — Congregational Nurse Program (Signed)
  Dept: [phone]   Congregational Nurse Program Note  Date of Encounter: 06/07/2023 BP (!) 95/58 (BP Location: Left Arm)   Pulse 72  Pt to community health clinic concerned that her vision is blurry and she cannot see the small words on her phone, but she can see when they are enlarged. Had pt try on a pair of readers and her vision improved dramatically. Inst pt on how to get fitted for readers, and where to find inexpensive ones. Additionally, discussed high blood sugar with pt and some steps she can do at home to lower her cbg. Pt had just eaten pasta about an hour before this cbg was taken. This clinic is closed for the next 2 weeks; inst pt to return in 3 weeks for re check.  Jimmye Norman, RN, CCNP   Past Medical History: Past Medical History:  Diagnosis Date   Medical history non-contributory     Encounter Details:  CNP Questionnaire - 06/07/23 1719       Questionnaire   Ask client: Do you give verbal consent for me to treat you today? Yes    Student Assistance N/A    Location Patient Served  [facility]    Visit Setting with Client Organization    Patient Status Migrant    Insurance Uninsured ([place] Card/Care Connects/Self-Pay/Medicaid Family Planning)    Insurance/Financial Assistance Referral [place] Card/Care Connects    Medication N/A    Medical Provider Yes    Screening Referrals Made N/A    Medical Referrals Made N/A    Medical Appointment Made N/A    Recently w/o PCP, now 1st time PCP visit completed due to CNs referral or appointment made N/A    Food N/A    Transportation N/A    Housing/Utilities N/A    Interpersonal Safety N/A    Interventions Advocate/Support;Navigate Healthcare System;Educate    Abnormal to Normal Screening Since Last CN Visit N/A    Screenings CN Performed Blood Pressure;Blood Glucose    Sent Client to Lab for: N/A    Did client attend any of the following based off CNs referral or appointments made? N/A    ED Visit Averted N/A     Life-Saving Intervention Made N/A

## 2023-08-08 ENCOUNTER — Ambulatory Visit: Payer: Self-pay | Attending: Internal Medicine | Admitting: Internal Medicine

## 2023-08-08 ENCOUNTER — Encounter: Payer: Self-pay | Admitting: Internal Medicine

## 2023-08-08 VITALS — BP 93/61 | HR 65 | Temp 97.8°F | Ht 59.0 in | Wt 121.0 lb

## 2023-08-08 DIAGNOSIS — H5203 Hypermetropia, bilateral: Secondary | ICD-10-CM

## 2023-08-08 DIAGNOSIS — Z1231 Encounter for screening mammogram for malignant neoplasm of breast: Secondary | ICD-10-CM

## 2023-08-08 DIAGNOSIS — Z124 Encounter for screening for malignant neoplasm of cervix: Secondary | ICD-10-CM

## 2023-08-08 DIAGNOSIS — Z2821 Immunization not carried out because of patient refusal: Secondary | ICD-10-CM

## 2023-08-08 DIAGNOSIS — R0789 Other chest pain: Secondary | ICD-10-CM

## 2023-08-08 DIAGNOSIS — Z0000 Encounter for general adult medical examination without abnormal findings: Secondary | ICD-10-CM

## 2023-08-08 NOTE — Progress Notes (Signed)
Patient ID: Joanna Vega, female    DOB: 1976-09-13  MRN: [id number]  CC: Annual Exam (Physical. /No questions / concerns/No to flu vax.)   Subjective: Joanna Vega is a 47 y.o. female who presents for physical.  Her teenage daughter and young son are with her. Her concerns today include:  Pt with hx of preDM, HL   [employer] Language interpreter used during this encounter. #[id number], Jari Favre  Declines having blood test today Last MMG was 09/29/2022. Last papwith negative HPV was 03/2020  Patient Active Problem List   Diagnosis Date Noted   Hyperlipidemia LDL goal <100 11/29/2021   Prediabetes 07/21/2021   OVARIAN CYST 12/07/2006     Current Outpatient Medications on File Prior to Visit  Medication Sig Dispense Refill   OVER THE COUNTER MEDICATION Sucrol. Pt states this is a OTC item from [place] (Patient not taking: Reported on 09/29/2022)     No current facility-administered medications on file prior to visit.    No Known Allergies  Social History   Socioeconomic History   Marital status: Significant Other    Spouse name: Rolm Baptise   Number of children: 5   Years of education: 4   Highest education level: 3rd grade  Occupational History   Occupation: Housewife  Tobacco Use   Smoking status: Never   Smokeless tobacco: Never  Vaping Use   Vaping status: Never Used  Substance and Sexual Activity   Alcohol use: No    Alcohol/week: 0.0 standard drinks of alcohol   Drug use: No   Sexual activity: Yes    Birth control/protection: I.U.D.  Other Topics Concern   Not on file  Social History Narrative   Originally from [place]   Came to [employer]. In 2007      Lives at home with husband and 3 of 5 children       2 adult children live in [place]   Social Determinants of Health   Financial Resource Strain: Low Risk  (08/08/2023)   Overall Financial Resource Strain (CARDIA)    Difficulty of Paying Living Expenses: Not hard at all  Food Insecurity:  No Food Insecurity (08/08/2023)   Hunger Vital Sign    Worried About Running Out of Food in the Last Year: Never true    Ran Out of Food in the Last Year: Never true  Transportation Needs: No Transportation Needs (09/29/2022)   PRAPARE - Administrator, Civil Service (Medical): No    Lack of Transportation (Non-Medical): No  Physical Activity: Insufficiently Active (08/08/2023)   Exercise Vital Sign    Days of Exercise per Week: 2 days    Minutes of Exercise per Session: 20 min  Stress: No Stress Concern Present (08/08/2023)   [employer] of Occupational Health - Occupational Stress Questionnaire    Feeling of Stress : Not at all  Social Connections: Moderately Integrated (08/08/2023)   Social Connection and Isolation Panel [NHANES]    Frequency of Communication with Friends and Family: More than three times a week    Frequency of Social Gatherings with Friends and Family: More than three times a week    Attends Religious Services: 1 to 4 times per year    Active Member of [employer] or Organizations: No    Attends Banker Meetings: Never    Marital Status: Living with partner  Intimate Partner Violence: Not At Risk (08/08/2023)   Humiliation, Afraid, Rape, and Kick questionnaire    Fear of Current or Ex-Partner: No  Emotionally Abused: No    Physically Abused: No    Sexually Abused: No    Family History  Problem Relation Age of Onset   Asthma Mother    Alcohol abuse Father    Breast cancer Neg Hx     Past Surgical History:  Procedure Laterality Date   NO PAST SURGERIES      ROS: Review of Systems  Constitutional:        Does walk few days a wk for exercise  HENT:  Negative for congestion, hearing loss and trouble swallowing.   Eyes:        Is reading things close up.  She has not had a eye exam in years  Respiratory:  Negative for cough and shortness of breath.   Cardiovascular:        Sometimes gets a tired feeling in RT upper chest  that can last 1 minute.  Occurs about once a wk.  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  Gastrointestinal:  Negative for abdominal pain.       Moving bowels okay  Genitourinary:  Negative for difficulty urinating.  Musculoskeletal:  Negative for arthralgias.  Neurological:  Negative for dizziness.    PHYSICAL EXAM: BP 93/61 (BP Location: Left Arm, Patient Position: Sitting, Cuff Size: Normal)   Pulse 65   Temp 97.8 F (36.6 C) (Oral)   Ht 4\' 11"  (1.499 m)   Wt 121 lb (54.9 kg)   SpO2 99%   BMI 24.44 kg/m   Physical Exam  General appearance - alert, well appearing, and in no distress Mental status - normal mood, behavior, speech, dress, motor activity, and thought processes Eyes - pupils equal and reactive, extraocular eye movements intact Ears - bilateral TM's and external ear canals normal Nose -moderate enlargement of nasal turbinates Mouth -oral mucosa is moist.  She has partial denture above. Neck - supple, no significant adenopathy Lymphatics - no palpable lymphadenopathy, no hepatosplenomegaly Chest - clear to auscultation, no wheezes, rales or rhonchi, symmetric air entry Heart - normal rate, regular rhythm, normal S1, S2, no murmurs, rubs, clicks or gallops Abdomen - soft, nontender, nondistended, no masses or organomegaly Neurological - cranial nerves II through XII intact, motor and sensory grossly normal bilaterally Musculoskeletal - no joint tenderness, deformity or swelling Extremities - peripheral pulses normal, no pedal edema, no clubbing or cyanosis Skin -patient has a 1.5 cm hyperpigmented flat mole on LT upper arm.  She confirms this as being a birth mark      Latest Ref Rng & Units 11/29/2021    4:30 PM 06/30/2021    8:59 AM 01/27/2014    5:23 PM  CMP  Glucose 70 - 99 mg/dL 95  696  295   BUN 6 - 24 mg/dL 15   18   Creatinine 2.84 - 1.00 mg/dL 1.32   4.40   Sodium 102 - 144 mmol/L 139   137   Potassium  3.5 - 5.2 mmol/L 3.9   4.1   Chloride 96 - 106 mmol/L 103   100   CO2 20 - 29 mmol/L 24   24   Calcium 8.7 - 10.2 mg/dL 8.8   9.0   Total Protein 6.0 - 8.5 g/dL 7.2   7.6   Total Bilirubin 0.0 - 1.2 mg/dL 0.2   <7.2   Alkaline Phos 44 - 121 IU/L 79   68   AST 0 - 40 IU/L  14   18   ALT 0 - 32 IU/L 17   19    Lipid Panel     Component Value Date/Time   CHOL 177 06/30/2021 0859   TRIG 111 06/30/2021 0859   HDL 49 06/30/2021 0859   CHOLHDL 3.6 06/30/2021 0859   LDLCALC 108 (H) 06/30/2021 0859    CBC    Component Value Date/Time   WBC 6.6 11/29/2021 1630   WBC 16.6 (H) 01/06/2015 1400   RBC 4.18 11/29/2021 1630   RBC 3.36 (L) 01/06/2015 1400   HGB 12.4 11/29/2021 1630   HCT 36.7 11/29/2021 1630   PLT 281 11/29/2021 1630   MCV 88 11/29/2021 1630   MCH 29.7 11/29/2021 1630   MCH 30.7 01/06/2015 1400   MCHC 33.8 11/29/2021 1630   MCHC 34.0 01/06/2015 1400   RDW 13.1 11/29/2021 1630   LYMPHSABS 2.2 01/27/2014 1723   MONOABS 0.6 01/27/2014 1723   EOSABS 0.4 01/27/2014 1723   BASOSABS 0.1 01/27/2014 1723    ASSESSMENT AND PLAN: 1. Annual physical exam Patient advised to eliminate sugary drinks from the diet, cut back on portion sizes especially of white carbohydrates, eat more white lean meat like chicken [place] and seafood instead of beef or pork and incorporate fresh fruits and vegetables into the diet daily. Encouraged her to continue regular exercise  2. Other chest pain Sounds atypical.  Advised that if this starts occurring more frequently and lasting longer or becomes associated with exertion, she should follow-up with [place] for further evaluation  3. Hyperopia of both eyes Recommend getting routine eye exam at least once every 2 years  4. Encounter for screening mammogram for malignant neoplasm of breast Mammogram scholarship given that she will come due for mammogram the end of this year  5. Influenza vaccination declined  Patient was given the opportunity to ask  questions.  Patient verbalized understanding of the plan and was able to repeat key elements of the plan.   This documentation was completed using Paediatric nurse.  Any transcriptional errors are unintentional.  No orders of the defined types were placed in this encounter.    Requested Prescriptions    No prescriptions requested or ordered in this encounter    No follow-ups on file.  Jonah Blue, MD, FACP

## 2023-08-08 NOTE — Patient Instructions (Signed)
Cuidados preventivos en las mujeres de 40 a 64 aos de edad Preventive Care 43-45 Years Old, Female Los cuidados preventivos hacen referencia a las opciones en cuanto al estilo de vida y a las visitas al mdico, las cuales pueden promover la salud y Counsellor. Las visitas de cuidado preventivo tambin se denominan exmenes de Health visitor. Qu puedo esperar para mi visita de cuidado preventivo? Asesoramiento Su mdico puede preguntarle acerca de: Antecedentes mdicos, incluidos los siguientes: Problemas mdicos pasados. Antecedentes mdicos familiares. Antecedentes de embarazo. Salud actual, incluido lo siguiente: Ciclo menstrual. Mtodos anticonceptivos. Su bienestar emocional. Training and development officer y las relaciones personales. Actividad sexual y salud sexual. Doran Clay de vida, incluido lo siguiente: Consumo de alcohol, nicotina, tabaco o drogas. Acceso a armas de fuego. Hbitos de alimentacin, ejercicio y sueo. Su trabajo y [place] laboral. Uso de pantalla solar. Cuestiones de seguridad, como el uso de cinturn de seguridad y casco de Scientist, research (physical sciences). Examen fsico El mdico revisar lo siguiente: Diplomatic Services operational officer y [place]. Estos pueden usarse para calcular el IMC (ndice de masa corporal). El [facility] es una medicin que indica si tiene un peso saludable. Circunferencia de la cintura. Es Neomia Dear medicin alrededor de Lobbyist. Esta medicin tambin indica si tiene un peso saludable y puede ayudar a predecir su riesgo de padecer ciertas enfermedades, como diabetes tipo 2 y presin arterial alta. Frecuencia cardaca y presin arterial. Temperatura corporal. Piel para detectar manchas anormales. Qu vacunas necesito?  Las vacunas se aplican a varias edades, segn un cronograma. El [employer] recomendar vacunas segn su edad, sus antecedentes mdicos, su estilo de vida y [address], como los viajes o el lugar donde trabaja. Qu pruebas necesito? Pruebas de deteccin El mdico puede recomendar  pruebas de deteccin de ciertas afecciones. Esto puede incluir: Niveles de lpidos y colesterol. Pruebas de deteccin de la diabetes. Esto se Physiological scientist un control del azcar en la sangre (glucosa) despus de no haber comido durante un periodo de tiempo (ayuno). Examen plvico y prueba de Papanicolaou. Prueba de hepatitis B. Prueba de hepatitis C. Prueba del VIH (virus de inmunodeficiencia humana). Pruebas de infecciones de transmisin sexual (ITS), si est en riesgo. Pruebas de deteccin de cncer de pulmn. Pruebas de deteccin de cncer colorrectal. Mamografa. Hable con su mdico sobre cundo debe comenzar a Health and safety inspector de [place] regular. Esto depende de si tiene antecedentes familiares de cncer de mama o no. Pruebas de deteccin de cncer relacionado con las mutaciones del BRCA. Es posible que se las deba realizar si tiene antecedentes de cncer de mama, de ovario, de trompas o peritoneal. Densitometra sea. Esto se realiza para detectar osteoporosis. Hable con su mdico [employer], las opciones de tratamiento y, si corresponde, la necesidad de Education officer, environmental ms pruebas. Siga estas instrucciones en su casa: Comida y bebida  Siga una dieta que incluya frutas y verduras frescas, cereales integrales, protenas magras y productos lcteos descremados. Tome los suplementos vitamnicos y [employer] se lo haya indicado el mdico. No beba alcohol si: Su mdico le indica no hacerlo. Est embarazada, puede estar embarazada o est tratando de [place]. Si bebe alcohol: Limite la cantidad que consume de 0 a 1 medida por da. Sepa cunta cantidad de alcohol hay en las bebidas que toma. En los [address], una medida equivale a una botella de cerveza de 12 oz (355 ml), un vaso de vino de 5 oz (148 ml) o un vaso de una bebida alcohlica de alta graduacin de 1 oz (44  ml). Estilo de vida [employer] dientes a la maana y a la noche con pasta  dental con fluoruro. Use hilo dental una vez al da. Haga al menos 30 minutos de ejercicio, 5 o ms [address]. No consuma ningn producto que contenga nicotina o tabaco. Estos productos incluyen cigarrillos, tabaco para Theatre manager y aparatos de vapeo, como los Administrator, Civil Service. Si necesita ayuda para dejar de fumar, consulte al mdico. No consuma drogas. Si es sexualmente activa, practique sexo seguro. Use un condn u otra forma de proteccin para prevenir las infecciones de transmisin sexual (ITS). Si no desea quedar embarazada, use un mtodo anticonceptivo. Si busca un embarazo, realice una consulta previa al [employer] con el mdico. Tome aspirina nicamente como se lo haya indicado el mdico. Asegrese de que comprende qu cantidad y cul presentacin debe tomar. Trabaje con el mdico para averiguar si es seguro y beneficioso para usted tomar aspirina a diario. Busque maneras saludables de Charity fundraiser, tales como: Meditacin, yoga o Optometrist. Lleve un diario personal. Hable con una persona confiable. Pase tiempo con amigos y familiares. Minimice la exposicin a la radiacin UV para reducir el riesgo de cncer de piel. Seguridad [place] siempre el cinturn de seguridad al conducir o viajar en un vehculo. No conduzca: Si ha estado bebiendo alcohol. No viaje con un conductor que ha estado bebiendo. Si est cansada o distrada. Mientras est enviando mensajes de texto. Si ha estado usando sustancias o drogas que alteran la funcin mental. Use un casco y otros equipos de proteccin durante las actividades deportivas. Si tiene armas de fuego en su casa, asegrese de seguir todos los procedimientos de seguridad correspondientes. Busque ayuda si fue vctima de abuso fsico o abuso sexual. Cundo volver? Visite al mdico una vez al ao para una visita anual de control de bienestar. Pregntele al mdico con qu frecuencia debe realizarse un control de la vista y los  dientes. Mantenga su esquema de vacunacin al da. Esta informacin no tiene Theme park manager el consejo del mdico. Asegrese de hacerle al mdico cualquier pregunta que tenga. Document Revised: 04/15/2021 Document Reviewed: 04/15/2021 Elsevier Patient Education  2024 [employer].

## 2023-10-26 ENCOUNTER — Ambulatory Visit
Admission: RE | Admit: 2023-10-26 | Discharge: 2023-10-26 | Disposition: A | Discharge status: Home / Self Care | Payer: Self-pay | Source: Ambulatory Visit | Attending: Internal Medicine | Admitting: Internal Medicine

## 2023-10-26 DIAGNOSIS — Z1231 Encounter for screening mammogram for malignant neoplasm of breast: Secondary | ICD-10-CM

## 2023-10-27 NOTE — Progress Notes (Unsigned)
 [facility] initial screening   Interpreter- ***   Clinical Measurement: There were no vitals filed for this visit. Fasting Labs Drawn Today, will review with patient when they result.   Medical History: Patient states that she {Blank single:19197::"has","does not have","does not know if she has"} high cholesterol, {Blank single:19197::"has","does not have","does not know if she has"} high blood pressure and she {Blank single:19197::"has","does not have","does not know if she has"} diabetes. Patient states that she {Blank single:19197::"has","does not have","does not know if she has"} history of gestational hypertension, {Blank single:19197::"has","does not have","does not know if she has"} history of pre-eclampsia/eclampsia and she {Blank single:19197::"has","does not have","does not know if she has"} history of gestational diabetes.    Medications: Patient states that she {Blank single:19197::"does","does not"} take medication to lower cholesterol, blood pressure or blood sugar.  Patient {Blank single:19197::"does","does not"} take an aspirin a day to help prevent a heart attack or stroke. During the past 7 days patient {Blank single:19197::"has","has not","N/A"} taken prescribed medication to lower {Blank single:19197::"cholesterol","blood pressure","blood sugar"} on *** days.   Blood pressure, self measurement: Patient states that she {Blank single:19197::"does","does not","does not have the equipment to"} measure blood pressure from home. She checks her blood pressure {Blank single:19197::"multiple times per day","daily","a few times per week","weekly","monthly","N/A"}. She shares her readings with a health care provider: {Blank single:19197::"yes","no","N/A"}.   Nutrition: Patient states that on average she eats *** cups of fruit and *** cups of vegetables per day. Patient states that she {Blank single:19197::"does","does not"} eat fish at least 2 times per week. Patient eats {Blank  single:19197::"less than half","about half","more than half"} servings of whole grains. Patient drinks less than 36 ounces of beverages with added sugar weekly: {Blank single:19197::"yes","no"}. Patient is currently watching sodium or salt intake: {Blank single:19197::"yes","no"}. In the past 7 days patient has consumed drinks containing alcohol on *** days. On a day that patient consumes drinks containing alcohol on average *** drinks are consumed.      Physical activity: Patient states that she gets *** minutes of moderate and *** minutes of vigorous physical activity each week.  Smoking status: Patient states that she has {Blank single:19197::"has never smoked","is a former smoker, quit 1-12 months ago","is a former smoker, quit 12+ months ago","is a current smoker"} .   Quality of life: Over the past 2 weeks patient states that she had little interest or pleasure in doing things: {Blank single:19197::"not at all","several days","more than half","nearly everyday"}. She has been feeling down, depressed or hopeless:{Blank single:19197::"not at all","several days","more than half","nearly everyday"}.   Social Determinants of Health Assessment:   Computer Use: During the last 12 months patient states that she has used any of the following: desktop/laptop, smart phone or tablet/other portable wireless computer: {Blank single:19197::"yes","no","don't know"}.   Internet Use: During the last 12 months, did you or any member of your household have access to the internet: {Blank single:19197::"Yes, by paying a cell phone company or internet service provider","Yes, without paying a cell phone company or internet service provider","No access to internet in this house,apartment or mobile home"}.   Food Insecurities: During the last 12 months, where there any times when you were worried that you would run out of food because of a lack of money or other resources: {Blank single:19197::"Yes","No"}.    Transportation Barriers: During the last 12 months, have you missed a doctor's appointment because of transportation problems: {Blank single:19197::"Yes","No"}.   Childcare Barriers: If you are currently using childcare services, please identify  the type of services you use. (If not using childcare services,  please select "Not applicable"): {Blank single:19197::"infant (birth to 19 months","toddler (11 to 36 months)","preschool (3 to 5 years)","after school care (K-9th grade)","not applicable"}. During the last 12 months, have you had any barriers to childcare services such as: {Blank single:19197::"cost","availability","location","transportation","hours of operation","other","not applicable"}.   Housing: What is your housing situation today: {Blank single:19197::"I have housing","I have housing, but I am worried about losing my housing","I do not have housing"}.   Intimate Partner Violence: During the last 12 months, how often did your partner physically hurt you: {Blank single:19197::"never","rarely","sometimes","fairly often","frequently","don't want to answer"}. During the last 12 months, how often did your partner insult you or talk down to you: {Blank single:19197::"never","rarely","sometimes","fairly often","frequently","don't want to answer"}.  Medication Adherence: During the last 12 months, did you ever forget to take your medicine: {Blank single:19197::"Yes","No","not applicable"}. During the last 12 months, were you careless ar times about taking your medicine: {Blank single:19197::"Yes","No","not applicable"}. During the last 12 months, when you felt better did you sometimes stop taking your medication: {Blank single:19197::"Yes","No","not applicable"}. During the last 12 months, sometimes if you felt worse when you took your medicine did you stop taking it: {Blank single:19197::"Yes","No","not applicable"}.   Risk reduction and counseling:   Health Coaching:  Goal:   Navigation:   I will notify patient of lab results.  Patient is aware of 2 more health coaching sessions and a follow up.  Time: *** minutes

## 2023-10-30 ENCOUNTER — Other Ambulatory Visit: Payer: Self-pay

## 2023-10-30 ENCOUNTER — Inpatient Hospital Stay: Payer: No Typology Code available for payment source | Attending: Obstetrics and Gynecology | Admitting: *Deleted

## 2023-10-30 VITALS — BP 117/77 | Ht 59.0 in | Wt 118.5 lb

## 2023-10-30 DIAGNOSIS — Z0000 Encounter for general adult medical examination without abnormal findings: Secondary | ICD-10-CM

## 2023-10-31 LAB — LIPID PANEL
Chol/HDL Ratio: 3.3 {ratio} (ref 0.0–4.4)
Cholesterol, Total: 182 mg/dL (ref 100–199)
HDL: 56 mg/dL (ref >39)
LDL Chol Calc (NIH): 112 mg/dL — ABNORMAL HIGH (ref 0–99)
Triglycerides: 78 mg/dL (ref 0–149)
VLDL Cholesterol Cal: 14 mg/dL (ref 5–40)

## 2023-10-31 LAB — GLUCOSE, RANDOM: Glucose: 108 mg/dL — ABNORMAL HIGH (ref 70–99)

## 2023-10-31 LAB — HEMOGLOBIN A1C
Est. average glucose Bld gHb Est-mCnc: 126 mg/dL
Hgb A1c MFr Bld: 6 % — ABNORMAL HIGH (ref 4.8–5.6)

## 2023-11-03 ENCOUNTER — Telehealth: Payer: Self-pay

## 2023-11-03 NOTE — Telephone Encounter (Signed)
Health coaching 2   interpreter- Natale Lay, [employer]   Labs-182 cholesterol, 112 LDL cholesterol, 78 triglycerides, 56 HDL cholesterol, 6.0 hemoglobin A1C, 108 mean plasma glucose. Patient understands and is aware of her lab results.   Goals-  1. Reduce the amount of fried and fatty foods consumed. Try to grill, bake, broil or sautee foods instead. 2. Reduce the amount of sweet and sugary foods and drinks consumed. 3. Reduce the amount of carb rich foods consumed. 4. Daily exercise for 20-30 minutes.   Navigation:  Patient is aware of 1 more health coaching sessions and a follow up. Patient is scheduled for FU with [facility] on 11/30/23 for elevated labs.  Time- 15 minutes

## 2023-11-30 ENCOUNTER — Ambulatory Visit: Payer: Self-pay | Attending: Internal Medicine | Admitting: Internal Medicine

## 2024-02-06 ENCOUNTER — Ambulatory Visit: Payer: Self-pay | Admitting: Internal Medicine

## 2024-07-02 ENCOUNTER — Other Ambulatory Visit: Payer: Self-pay | Admitting: Obstetrics & Gynecology

## 2024-07-02 DIAGNOSIS — Z1231 Encounter for screening mammogram for malignant neoplasm of breast: Secondary | ICD-10-CM

## 2024-08-12 ENCOUNTER — Telehealth: Payer: Self-pay

## 2024-08-12 NOTE — Telephone Encounter (Signed)
 Health Coaching 3  interpreter- Bernice Angry, [facility]   Goals- Patient has been working on reducing the amount of fried foods consumed as well as trying to walk more often. Spoke with patient about diet and lifestyle modifications.    Navigation:  Patient is aware of  a follow up session. Patient is scheduled for FU on 09/23/24 @ 8:15 am.

## 2024-09-23 ENCOUNTER — Inpatient Hospital Stay

## 2024-10-31 ENCOUNTER — Ambulatory Visit
Admission: RE | Admit: 2024-10-31 | Discharge: 2024-10-31 | Disposition: A | Discharge status: Home / Self Care | Source: Ambulatory Visit | Attending: Obstetrics & Gynecology | Admitting: Obstetrics & Gynecology

## 2024-10-31 DIAGNOSIS — Z1231 Encounter for screening mammogram for malignant neoplasm of breast: Secondary | ICD-10-CM

## 2024-11-05 ENCOUNTER — Ambulatory Visit: Payer: Self-pay | Admitting: Obstetrics & Gynecology
